# Patient Record
Sex: Female | Born: 1965 | Race: White | Hispanic: No | Smoking: Current every day smoker
Health system: Southern US, Community
[De-identification: ages and names within clinical notes are randomized; demographics above are authoritative.]

## PROBLEM LIST (undated history)

## (undated) DIAGNOSIS — K5900 Constipation, unspecified: Secondary | ICD-10-CM

## (undated) DIAGNOSIS — M797 Fibromyalgia: Secondary | ICD-10-CM

## (undated) DIAGNOSIS — Z8669 Personal history of other diseases of the nervous system and sense organs: Secondary | ICD-10-CM

## (undated) DIAGNOSIS — F419 Anxiety disorder, unspecified: Secondary | ICD-10-CM

## (undated) DIAGNOSIS — M2550 Pain in unspecified joint: Secondary | ICD-10-CM

## (undated) DIAGNOSIS — G8929 Other chronic pain: Secondary | ICD-10-CM

## (undated) DIAGNOSIS — T4145XA Adverse effect of unspecified anesthetic, initial encounter: Secondary | ICD-10-CM

## (undated) DIAGNOSIS — D649 Anemia, unspecified: Secondary | ICD-10-CM

## (undated) DIAGNOSIS — M2540 Effusion, unspecified joint: Secondary | ICD-10-CM

## (undated) DIAGNOSIS — G4700 Insomnia, unspecified: Secondary | ICD-10-CM

## (undated) DIAGNOSIS — T8859XA Other complications of anesthesia, initial encounter: Secondary | ICD-10-CM

## (undated) DIAGNOSIS — F410 Panic disorder [episodic paroxysmal anxiety] without agoraphobia: Secondary | ICD-10-CM

## (undated) DIAGNOSIS — E039 Hypothyroidism, unspecified: Secondary | ICD-10-CM

## (undated) DIAGNOSIS — Z8709 Personal history of other diseases of the respiratory system: Secondary | ICD-10-CM

## (undated) DIAGNOSIS — H04123 Dry eye syndrome of bilateral lacrimal glands: Secondary | ICD-10-CM

## (undated) DIAGNOSIS — F32A Depression, unspecified: Secondary | ICD-10-CM

## (undated) DIAGNOSIS — M549 Dorsalgia, unspecified: Secondary | ICD-10-CM

## (undated) DIAGNOSIS — IMO0002 Reserved for concepts with insufficient information to code with codable children: Secondary | ICD-10-CM

## (undated) DIAGNOSIS — K219 Gastro-esophageal reflux disease without esophagitis: Secondary | ICD-10-CM

## (undated) DIAGNOSIS — F329 Major depressive disorder, single episode, unspecified: Secondary | ICD-10-CM

## (undated) HISTORY — PX: CHOLECYSTECTOMY: SHX55

## (undated) HISTORY — PX: BILATERAL SALPINGOOPHORECTOMY: SHX1223

## (undated) HISTORY — PX: APPENDECTOMY: SHX54

## (undated) HISTORY — PX: TONSILLECTOMY: SUR1361

## (undated) HISTORY — PX: DIAGNOSTIC LAPAROSCOPY: SUR761

## (undated) HISTORY — PX: OTHER SURGICAL HISTORY: SHX169

## (undated) HISTORY — PX: ABDOMINAL HYSTERECTOMY: SHX81

---

## 2000-07-10 ENCOUNTER — Encounter: Payer: Self-pay | Admitting: Internal Medicine

## 2000-07-10 ENCOUNTER — Ambulatory Visit (HOSPITAL_COMMUNITY): Admission: RE | Admit: 2000-07-10 | Discharge: 2000-07-10 | Payer: Self-pay | Admitting: Internal Medicine

## 2001-03-11 ENCOUNTER — Encounter (HOSPITAL_COMMUNITY): Admission: RE | Admit: 2001-03-11 | Discharge: 2001-04-10 | Payer: Self-pay | Admitting: Family Medicine

## 2002-03-19 ENCOUNTER — Encounter: Payer: Self-pay | Admitting: Internal Medicine

## 2002-03-19 ENCOUNTER — Emergency Department (HOSPITAL_COMMUNITY): Admission: EM | Admit: 2002-03-19 | Discharge: 2002-03-19 | Payer: Self-pay | Admitting: Emergency Medicine

## 2002-03-21 ENCOUNTER — Encounter: Payer: Self-pay | Admitting: Internal Medicine

## 2002-03-21 ENCOUNTER — Ambulatory Visit (HOSPITAL_COMMUNITY): Admission: RE | Admit: 2002-03-21 | Discharge: 2002-03-21 | Payer: Self-pay | Admitting: Internal Medicine

## 2002-04-05 ENCOUNTER — Observation Stay (HOSPITAL_COMMUNITY): Admission: RE | Admit: 2002-04-05 | Discharge: 2002-04-07 | Payer: Self-pay | Admitting: General Surgery

## 2004-06-12 ENCOUNTER — Emergency Department (HOSPITAL_COMMUNITY): Admission: EM | Admit: 2004-06-12 | Discharge: 2004-06-12 | Payer: Self-pay | Admitting: Emergency Medicine

## 2005-03-25 ENCOUNTER — Emergency Department (HOSPITAL_COMMUNITY): Admission: EM | Admit: 2005-03-25 | Discharge: 2005-03-25 | Payer: Self-pay | Admitting: Emergency Medicine

## 2008-03-03 HISTORY — PX: KNEE ARTHROSCOPY: SUR90

## 2009-01-16 ENCOUNTER — Encounter: Admission: RE | Admit: 2009-01-16 | Discharge: 2009-01-16 | Payer: Self-pay | Admitting: Orthopedic Surgery

## 2009-03-03 HISTORY — PX: BACK SURGERY: SHX140

## 2009-03-03 HISTORY — PX: CERVICAL FUSION: SHX112

## 2009-12-05 ENCOUNTER — Inpatient Hospital Stay (HOSPITAL_COMMUNITY): Admission: RE | Admit: 2009-12-05 | Discharge: 2009-12-06 | Payer: Self-pay | Admitting: *Deleted

## 2010-01-23 ENCOUNTER — Ambulatory Visit (HOSPITAL_COMMUNITY)
Admission: RE | Admit: 2010-01-23 | Discharge: 2010-01-23 | Payer: Self-pay | Source: Home / Self Care | Attending: *Deleted | Admitting: *Deleted

## 2010-01-31 ENCOUNTER — Observation Stay (HOSPITAL_COMMUNITY)
Admission: RE | Admit: 2010-01-31 | Discharge: 2010-02-01 | Payer: Self-pay | Source: Home / Self Care | Attending: *Deleted | Admitting: *Deleted

## 2010-05-14 LAB — PROTIME-INR
INR: 0.93 (ref 0.00–1.49)
INR: 0.91 (ref 0.00–1.49)
Prothrombin Time: 12.7 seconds (ref 11.6–15.2)

## 2010-05-14 LAB — URINALYSIS, ROUTINE W REFLEX MICROSCOPIC
Bilirubin Urine: NEGATIVE
Glucose, UA: NEGATIVE mg/dL
Glucose, UA: NEGATIVE mg/dL
Ketones, ur: NEGATIVE mg/dL
Ketones, ur: NEGATIVE mg/dL
Leukocytes, UA: NEGATIVE
Leukocytes, UA: NEGATIVE
Nitrite: NEGATIVE
Nitrite: NEGATIVE
Protein, ur: NEGATIVE mg/dL
Protein, ur: NEGATIVE mg/dL
Specific Gravity, Urine: 1.022 (ref 1.005–1.030)
Specific Gravity, Urine: 1.009 (ref 1.005–1.030)
Urobilinogen, UA: 1 mg/dL (ref 0.0–1.0)
Urobilinogen, UA: 0.2 mg/dL (ref 0.0–1.0)
pH: 6 (ref 5.0–8.0)
pH: 6 (ref 5.0–8.0)

## 2010-05-14 LAB — TYPE AND SCREEN
ABO/RH(D): B POS
ABO/RH(D): B POS
Antibody Screen: NEGATIVE

## 2010-05-14 LAB — COMPREHENSIVE METABOLIC PANEL WITH GFR
ALT: 48 U/L — ABNORMAL HIGH (ref 0–35)
AST: 46 U/L — ABNORMAL HIGH (ref 0–37)
Albumin: 3.7 g/dL (ref 3.5–5.2)
Alkaline Phosphatase: 110 U/L (ref 39–117)
BUN: 8 mg/dL (ref 6–23)
CO2: 26 meq/L (ref 19–32)
Calcium: 8.5 mg/dL (ref 8.4–10.5)
Chloride: 96 meq/L (ref 96–112)
Creatinine, Ser: 0.9 mg/dL (ref 0.4–1.2)
GFR calc non Af Amer: >60 mL/min
Glucose, Bld: 104 mg/dL — ABNORMAL HIGH (ref 70–99)
Potassium: 3.2 meq/L — ABNORMAL LOW (ref 3.5–5.1)
Sodium: 136 meq/L (ref 135–145)
Total Bilirubin: 0.3 mg/dL (ref 0.3–1.2)
Total Protein: 6.1 g/dL (ref 6.0–8.3)

## 2010-05-14 LAB — CBC
HCT: 35.8 % — ABNORMAL LOW (ref 36.0–46.0)
HCT: 35.2 % — ABNORMAL LOW (ref 36.0–46.0)
Hemoglobin: 12.2 g/dL (ref 12.0–15.0)
Hemoglobin: 11.8 g/dL — ABNORMAL LOW (ref 12.0–15.0)
MCH: 31.6 pg (ref 26.0–34.0)
MCH: 32 pg (ref 26.0–34.0)
MCHC: 34.1 g/dL (ref 30.0–36.0)
MCHC: 33.5 g/dL (ref 30.0–36.0)
MCV: 92.7 fL (ref 78.0–100.0)
MCV: 95.4 fL (ref 78.0–100.0)
Platelets: 227 10*3/uL (ref 150–400)
Platelets: 276 10*3/uL (ref 150–400)
RBC: 3.86 MIL/uL — ABNORMAL LOW (ref 3.87–5.11)
RBC: 3.69 MIL/uL — ABNORMAL LOW (ref 3.87–5.11)
RDW: 13.2 % (ref 11.5–15.5)
RDW: 13.2 % (ref 11.5–15.5)
WBC: 5.9 10*3/uL (ref 4.0–10.5)
WBC: 7 10*3/uL (ref 4.0–10.5)

## 2010-05-14 LAB — DIFFERENTIAL
Basophils Absolute: 0 10*3/uL (ref 0.0–0.1)
Basophils Relative: 1 % (ref 0–1)
Eosinophils Absolute: 0.3 10*3/uL (ref 0.0–0.7)
Eosinophils Absolute: 0.3 10*3/uL (ref 0.0–0.7)
Eosinophils Relative: 5 % (ref 0–5)
Lymphocytes Relative: 15 % (ref 12–46)
Lymphs Abs: 0.9 10*3/uL (ref 0.7–4.0)
Lymphs Abs: 2.7 10*3/uL (ref 0.7–4.0)
Monocytes Absolute: 0.4 10*3/uL (ref 0.1–1.0)
Monocytes Relative: 5 % (ref 3–12)
Neutrophils Relative %: 75 % (ref 43–77)
Neutrophils Relative %: 51 % (ref 43–77)

## 2010-05-14 LAB — COMPREHENSIVE METABOLIC PANEL
ALT: 22 U/L (ref 0–35)
AST: 23 U/L (ref 0–37)
Albumin: 3.4 g/dL — ABNORMAL LOW (ref 3.5–5.2)
Alkaline Phosphatase: 77 U/L (ref 39–117)
Calcium: 8.6 mg/dL (ref 8.4–10.5)
GFR calc Af Amer: >60 mL/min (ref >60)
Potassium: 4 mEq/L (ref 3.5–5.1)
Sodium: 138 mEq/L (ref 135–145)
Total Protein: 5.7 g/dL — ABNORMAL LOW (ref 6.0–8.3)

## 2010-05-14 LAB — URINE MICROSCOPIC-ADD ON

## 2010-05-14 LAB — SURGICAL PCR SCREEN
MRSA, PCR: NEGATIVE
MRSA, PCR: NEGATIVE
Staphylococcus aureus: NEGATIVE

## 2010-05-14 LAB — APTT: aPTT: 27 s (ref 24–37)

## 2010-05-16 LAB — CBC
HCT: 39.1 % (ref 36.0–46.0)
MCH: 32.3 pg (ref 26.0–34.0)
MCHC: 34.3 g/dL (ref 30.0–36.0)
MCV: 94.2 fL (ref 78.0–100.0)
RDW: 13.4 % (ref 11.5–15.5)

## 2010-05-16 LAB — TYPE AND SCREEN
ABO/RH(D): B POS
Antibody Screen: NEGATIVE

## 2010-05-16 LAB — URINALYSIS, ROUTINE W REFLEX MICROSCOPIC
Bilirubin Urine: NEGATIVE
Nitrite: NEGATIVE
Specific Gravity, Urine: 1.005 (ref 1.005–1.030)
pH: 6.5 (ref 5.0–8.0)

## 2010-05-16 LAB — URINE MICROSCOPIC-ADD ON

## 2010-05-16 LAB — COMPREHENSIVE METABOLIC PANEL
BUN: 10 mg/dL (ref 6–23)
Calcium: 8.7 mg/dL (ref 8.4–10.5)
Creatinine, Ser: 0.74 mg/dL (ref 0.4–1.2)
GFR calc non Af Amer: >60 mL/min (ref >60)
Glucose, Bld: 88 mg/dL (ref 70–99)
Sodium: 137 mEq/L (ref 135–145)
Total Protein: 6.7 g/dL (ref 6.0–8.3)

## 2010-05-16 LAB — DIFFERENTIAL
Lymphs Abs: 2.9 10*3/uL (ref 0.7–4.0)
Monocytes Relative: 5 % (ref 3–12)
Neutro Abs: 10.1 10*3/uL — ABNORMAL HIGH (ref 1.7–7.7)
Neutrophils Relative %: 73 % (ref 43–77)

## 2010-05-16 LAB — APTT: aPTT: 26 seconds (ref 24–37)

## 2010-05-16 LAB — SURGICAL PCR SCREEN: MRSA, PCR: NEGATIVE

## 2010-05-16 LAB — PROTIME-INR
INR: 0.9 (ref 0.00–1.49)
Prothrombin Time: 12.4 seconds (ref 11.6–15.2)

## 2010-05-16 LAB — ABO/RH: ABO/RH(D): B POS

## 2010-07-19 NOTE — H&P (Signed)
   NAME:  Cynthia Barker, Cynthia Barker                         ACCOUNT NO.:  [id number]   MEDICAL RECORD NO.:  [id number]                   PATIENT TYPE:  AMB   LOCATION:  DAY                                  FACILITY:  [facility]   PHYSICIAN:  Jerolyn Shin C. Katrinka Blazing, M.D.                DATE OF BIRTH:  1965/12/08   DATE OF ADMISSION:  DATE OF DISCHARGE:                                HISTORY & PHYSICAL   HISTORY OF PRESENT ILLNESS:  [age]-year-old female with a six-month  history of pain in her right lower quadrant.  The pain is quite severe and  occurs three times per day, lasts a few minutes and then resolves.  She had  a severe attack about three weeks ago.  She has had nausea since that time.  She had an ultrasound of the abdomen which showed a questionable complex  right ovarian cyst.  A followup CT of the abdomen showed possible  inflammation of the appendix.  The patient has diffuse tenderness in the  right lower quadrant and is scheduled for a diagnostic laparoscopy and  laparoscopic appendectomy.   PAST HISTORY:  She has hypothyroidism, depression, seasonal allergies.   MEDICATIONS:  1. Synthroid 175 mcg daily.  2. Lexapro 10 mg daily.   PAST SURGICAL HISTORY:  C-section x3, total abdominal hysterectomy,  cholecystectomy and tonsillectomy.   FAMILY HISTORY:  Family history is positive for hypertension and diabetes  mellitus.   PHYSICAL EXAMINATION:  GENERAL:  On exam, she is a pleasant female in no  acute distress.  VITAL SIGNS:  Blood pressure 134/100, pulse 88, respirations 18.  Weight 254  pounds.  HEENT:  Unremarkable.  NECK:  Neck supple without JVD or bruit.  CHEST:  Chest clear to auscultation.  HEART:  Regular rate and rhythm without murmur, gallop or rub.  ABDOMEN:  Diffuse tenderness in the right lower quadrant and suprapubic  area.  Normoactive bowel sounds.  EXTREMITIES:  No cyanosis, clubbing or edema.  NEUROLOGIC:  Exam nonfocal.   IMPRESSION:  1. Chronic abdominal  pain with chronic right lower quadrant tenderness.  2. Hypotension.  3. Depression.   PLAN:  Diagnostic laparoscopy with probable laparoscopic appendectomy.                                              Dirk Dress. Katrinka Blazing, M.D.   LCS/MEDQ  D:  04/04/2002  T:  04/05/2002  Job:  [id number]

## 2010-07-19 NOTE — Op Note (Signed)
NAME:  Cynthia Barker, Cynthia Barker                         ACCOUNT NO.:  [id number]   MEDICAL RECORD NO.:  [id number]                   PATIENT TYPE:  OBV   LOCATION:  A305                                 FACILITY:  [facility]   PHYSICIAN:  Dirk Dress. Katrinka Blazing, M.D.                DATE OF BIRTH:  07/23/1965   DATE OF PROCEDURE:  04/05/2002  DATE OF DISCHARGE:                                 OPERATIVE REPORT   PREOPERATIVE DIAGNOSES:  1. Chronic pelvic pain.  2. Possible chronic appendicitis.   POSTOPERATIVE DIAGNOSES:  1. Chronic appendicitis.  2. Right paroophoritis.   PROCEDURES:  1. Laparoscopic appendectomy.  2. Pelvic adhesiolysis.   SURGEON:  Dirk Dress. Katrinka Blazing, M.D.   INDICATION FOR PROCEDURE:  A 45 year old female with history of recurrent  pain in the right lower quadrant for a duration of at least six months.  The  pain is quite severe, and it occurs at least three times daily.  It lasts a  few minutes and then resolved.  She had a severe attack three weeks ago.  She has had constant nausea since that time.  Ultrasound revealed complex  right ovarian cyst.  CT showed possible inflammation of the appendix.  The  patient had diffuse tenderness localized to the right lower quadrant, and  she is scheduled for diagnostic laparoscopy and appendectomy.   DESCRIPTION OF PROCEDURE:  Under general anesthesia, the patient's abdomen  was prepped and draped in a sterile field.  A supraumbilical incision was  made and a Veress needle was inserted uneventfully.  The abdomen was  insufflated with 3 L of CO2.  Using a Visiport guide, a 10 mm port was  placed.  A laparoscope was placed uneventfully.  Under videoscopic guidance,  a 5 mm port was placed in the suprapubic midline.  A 12 mm port was placed  in the left lower quadrant without difficulty.  Upon viewing the pelvis,  there was a thick, adherent ovary in the right lower pelvis.  There were  dense adhesions in the deep pelvis.  The ovary was densely  adherent to the  anterior abdominal from adhesions.  The appendix was lying in the right  gutter.  It was thickened, but there was no major inflammation or  inflammatory changes surrounding the appendix.  The adhesions in the pelvis  appeared to be to the bladder and adhesions of the bladder to the anterior  abdominal wall, probably from previous surgery.  The left ovary was not  visualized.  Initially the appendix was dissected.  Vessels of the  mesoappendix were clipped with hemoclips.  The base of the appendix was  transected using a vascular Endo-GIA stapler.  The appendix was retrieved  intact.  Next adhesions in the pelvis were taken down sharply using  Endoshears and electrocautery.  The ovary was gently dissected using sharp  and blunt dissection and was separated from the anterior abdominal  wall.  Because of the dense adhesions in this area, the ovary was covered in a  sheet of Interceed.  This was placed so as to try to prevent adhesions.  Copious irrigation was carried out.  The patient tolerated the procedure  well.  There was no bleeding at the end of the procedure.  CO2 was allowed  to escape from the abdomen, and the ports were  removed.  The incisions were closed using 0 Dexon on the fascia of the  larger incisions with staples on the skin.  The patient tolerated the  procedure well.  Dressings were placed.  She was awakened from anesthesia,  transferred to a bed, and taken to the postanesthetic care unit.                                                Dirk Dress. Katrinka Blazing, M.D.    LCS/MEDQ  D:  04/05/2002  T:  04/05/2002  Job:  [id number]

## 2010-08-30 ENCOUNTER — Emergency Department (HOSPITAL_COMMUNITY)
Admission: EM | Admit: 2010-08-30 | Discharge: 2010-08-30 | Disposition: A | Discharge status: Home / Self Care | Payer: Self-pay | Attending: Emergency Medicine | Admitting: Emergency Medicine

## 2010-08-30 ENCOUNTER — Emergency Department (HOSPITAL_COMMUNITY): Payer: Self-pay

## 2010-08-30 DIAGNOSIS — F3289 Other specified depressive episodes: Secondary | ICD-10-CM | POA: Insufficient documentation

## 2010-08-30 DIAGNOSIS — R059 Cough, unspecified: Secondary | ICD-10-CM | POA: Insufficient documentation

## 2010-08-30 DIAGNOSIS — E039 Hypothyroidism, unspecified: Secondary | ICD-10-CM | POA: Insufficient documentation

## 2010-08-30 DIAGNOSIS — R05 Cough: Secondary | ICD-10-CM | POA: Insufficient documentation

## 2010-08-30 DIAGNOSIS — F329 Major depressive disorder, single episode, unspecified: Secondary | ICD-10-CM | POA: Insufficient documentation

## 2012-09-28 ENCOUNTER — Encounter (HOSPITAL_COMMUNITY): Payer: Self-pay | Admitting: *Deleted

## 2012-09-28 ENCOUNTER — Emergency Department (HOSPITAL_COMMUNITY): Payer: Self-pay

## 2012-09-28 ENCOUNTER — Emergency Department (HOSPITAL_COMMUNITY)
Admission: EM | Admit: 2012-09-28 | Discharge: 2012-09-28 | Disposition: A | Discharge status: Home / Self Care | Payer: Self-pay | Attending: Emergency Medicine | Admitting: Emergency Medicine

## 2012-09-28 DIAGNOSIS — Z79899 Other long term (current) drug therapy: Secondary | ICD-10-CM | POA: Insufficient documentation

## 2012-09-28 DIAGNOSIS — R296 Repeated falls: Secondary | ICD-10-CM | POA: Insufficient documentation

## 2012-09-28 DIAGNOSIS — G8929 Other chronic pain: Secondary | ICD-10-CM | POA: Insufficient documentation

## 2012-09-28 DIAGNOSIS — S8392XA Sprain of unspecified site of left knee, initial encounter: Secondary | ICD-10-CM

## 2012-09-28 DIAGNOSIS — E039 Hypothyroidism, unspecified: Secondary | ICD-10-CM | POA: Insufficient documentation

## 2012-09-28 DIAGNOSIS — Y929 Unspecified place or not applicable: Secondary | ICD-10-CM | POA: Insufficient documentation

## 2012-09-28 DIAGNOSIS — IMO0002 Reserved for concepts with insufficient information to code with codable children: Secondary | ICD-10-CM | POA: Insufficient documentation

## 2012-09-28 DIAGNOSIS — M1710 Unilateral primary osteoarthritis, unspecified knee: Secondary | ICD-10-CM | POA: Insufficient documentation

## 2012-09-28 DIAGNOSIS — F17200 Nicotine dependence, unspecified, uncomplicated: Secondary | ICD-10-CM | POA: Insufficient documentation

## 2012-09-28 DIAGNOSIS — Y939 Activity, unspecified: Secondary | ICD-10-CM | POA: Insufficient documentation

## 2012-09-28 DIAGNOSIS — M1712 Unilateral primary osteoarthritis, left knee: Secondary | ICD-10-CM

## 2012-09-28 DIAGNOSIS — M545 Low back pain, unspecified: Secondary | ICD-10-CM | POA: Insufficient documentation

## 2012-09-28 NOTE — ED Notes (Signed)
Left knee pain x 2 months. Reports left knee "gave out" over the weekend, and then heard a "pop" last night; c/o worsening pain since then.

## 2012-09-29 NOTE — ED Provider Notes (Signed)
CSN: [id number]     Arrival date & time 09/28/12  1930 History     First MD Initiated Contact with Patient 09/28/12 1954     Chief Complaint  Patient presents with  . Knee Pain   (Consider location/radiation/quality/duration/timing/severity/associated sxs/prior Treatment) HPI Comments: Cynthia Barker is a 47 y.o. Female presenting with chronic bilateral knee pain,  But left greater than right for the past year,  And increasingly severe over the past 2 months.  She was walking into her home this last weekend when her left knee "gave out" on her causing a fall,  Then last night felt a sudden "popping" sensation with pain at the tibial tuberosity and now more severe pain and swelling.  She has chronic pain for which is on roxicodone 15 mg q 4 hours prn which is not relieving her symptoms.  She is scheduled to see Dr. Romeo Apple next week,  But was advised to come the ed tonight due to this increased pain.  Her pain is worse in the morning,  Reporting it is very difficult to weight bear when first getting out of bed in the morning.  She denies numbness or weakness in the left lower leg.  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       The history is provided by the patient and the spouse.    Past Medical History  Diagnosis Date  . Thyroid disease     hypothyroidism   Past Surgical History  Procedure Laterality Date  . Back surgery    . Knee arthroscopy     No family history on file. History  Substance Use Topics  . Smoking status: Current Every Day Smoker    Types: Cigarettes  . Smokeless tobacco: Not on file  . Alcohol Use: No   OB History   Grav Para Term Preterm Abortions TAB SAB Ect Mult Living                 Review of  Systems  Constitutional: Negative for fever.  Musculoskeletal: Positive for joint swelling, arthralgias and gait problem. Negative for myalgias.  Neurological: Negative for weakness and numbness.    Allergies  Dilaudid; Augmentin; and Onion  Home Medications   Current Outpatient Rx  Name  Route  Sig  Dispense  Refill  . ALPRAZolam (XANAX) 1 MG tablet   Oral   Take 1 mg by mouth daily as needed for sleep or anxiety.         . citalopram (CELEXA) 10 MG tablet   Oral   Take 10 mg by mouth daily.         Marland Kitchen docusate sodium (COLACE) 100 MG capsule   Oral   Take 100 mg by mouth daily.         . DULoxetine (CYMBALTA) 60 MG capsule   Oral   Take 60 mg by mouth daily.         Marland Kitchen estradiol (ESTRACE) 2 MG tablet   Oral   Take 2 mg by mouth daily.         Marland Kitchen gabapentin (NEURONTIN) 300 MG capsule   Oral   Take 300 mg by mouth 3 (three) times daily.         Marland Kitchen  ibuprofen (ADVIL,MOTRIN) 200 MG tablet   Oral   Take 200 mg by mouth every 6 (six) hours as needed for pain.         Marland Kitchen levothyroxine (SYNTHROID, LEVOTHROID) 175 MCG tablet   Oral   Take 175 mcg by mouth daily before breakfast.         . Methylcobalamin (B-12) 5000 MCG TBDP   Oral   Take 1 tablet by mouth daily.         Marland Kitchen oxyCODONE (ROXICODONE) 15 MG immediate release tablet   Oral   Take 15 mg by mouth every 4 (four) hours as needed for pain.         Marland Kitchen zinc gluconate 50 MG tablet   Oral   Take 50 mg by mouth daily.          BP 158/102  Pulse 73  Temp(Src) 98.4 F (36.9 C) (Oral)  Resp 24  Ht 5\' 7"  (1.702 m)  Wt 190 lb (86.183 kg)  BMI 29.75 kg/m2  SpO2 100% Physical Exam  Constitutional: She appears well-developed and well-nourished.  HENT:  Head: Atraumatic.  Neck: Normal range of motion.  Cardiovascular:  Pulses equal bilaterally  Musculoskeletal: She exhibits tenderness.       Left knee: She exhibits decreased range of motion, swelling and bony tenderness. She exhibits no  ecchymosis, no deformity, no erythema, normal alignment, no LCL laxity and no MCL laxity.  Moderate crepitus bilateral knees with ROM.  ttp left bilateral tibial plateau and tibial tuberosity. No palpable disruption of the patellar tendon,  Quad tendon is intact.  She can SLR with pain at her left knee, but not weakness.  Distal pulses intact,  Less than 3 sec cap refill.    Neurological: She is alert. She has normal strength. She displays normal reflexes. No sensory deficit.  Equal strength  Skin: Skin is warm and dry.  Psychiatric: She has a normal mood and affect.    ED Course   Procedures (including critical care time)  Labs Reviewed - No data to display Dg Knee Complete 4 Views Left  09/28/2012   *RADIOLOGY REPORT*  Clinical Data: Left knee pain.  The patient reports hearing a pop today.  Prior knee surgery.  LEFT KNEE - COMPLETE 4+ VIEW  Comparison: None.  Findings: The mineralization and alignment are normal.  There is no evidence of acute fracture or dislocation.  There are tricompartmental degenerative changes.  There is spurring at the quadriceps insertion on the patella.  No significant knee joint effusion is seen.  IMPRESSION: No acute osseous findings.  Tricompartmental degenerative changes.   Original Report Authenticated By: Carey Bullocks, M.D.   1. Knee sprain and strain, left, initial encounter   2. Osteoarthritis of left knee     MDM  Patients labs and/or radiological studies were viewed and considered during the medical decision making and disposition process. Discussed xray results.  Discussed that we cannot rule out possible patellar tendon injury, but no apparent complete tear, also with moderate djd, crepitus - may need mri for further evaluation of soft tissue structures.  Pt was placed in knee immobilizer for support,  Deferred crutches.  encouraged ice, elevation.  F/u with Dr. Romeo Apple as planned. Offered steroid course, pt did not desire this due to weight gain it  causes.  She will continue with her prescribed roxicodone, encouraged motrin.    The patient appears reasonably screened and/or stabilized for discharge and I doubt any other medical condition or other  EMC requiring further screening, evaluation, or treatment in the ED at this time prior to discharge.   Burgess Amor, PA-C 09/29/12 1420

## 2012-09-29 NOTE — ED Provider Notes (Signed)
Medical screening examination/treatment/procedure(s) were performed by non-physician practitioner and as supervising physician I was immediately available for consultation/collaboration.   Tredarius Cobern M Chayanne Speir, DO 09/29/12 1700 

## 2012-10-06 ENCOUNTER — Encounter: Payer: Self-pay | Admitting: Orthopedic Surgery

## 2012-10-06 ENCOUNTER — Ambulatory Visit (INDEPENDENT_AMBULATORY_CARE_PROVIDER_SITE_OTHER): Payer: 59 | Admitting: Orthopedic Surgery

## 2012-10-06 VITALS — BP 170/116 | Ht 67.0 in | Wt 210.0 lb

## 2012-10-06 DIAGNOSIS — S83207A Unspecified tear of unspecified meniscus, current injury, left knee, initial encounter: Secondary | ICD-10-CM

## 2012-10-06 DIAGNOSIS — IMO0002 Reserved for concepts with insufficient information to code with codable children: Secondary | ICD-10-CM

## 2012-10-06 DIAGNOSIS — S83242A Other tear of medial meniscus, current injury, left knee, initial encounter: Secondary | ICD-10-CM

## 2012-10-06 NOTE — Patient Instructions (Addendum)
MRI ORDERED 

## 2012-10-06 NOTE — Progress Notes (Signed)
Patient ID: Cynthia Barker, female   DOB: Apr 29, 1965, 47 y.o.   MRN: [id number] Chief Complaint  Patient presents with  . Knee Pain    Left knee pain, no recent  injury. Consult from Dr. Molli Hazard    History of present illness this is a 47 year old female status post cervical and lumbar fusions and left knee arthroscopy who presents with a new injury to her left knee after a fall loud pop was noted complains of medial knee pain severe. She has a history of lumbar pain which is chronic after her fusion her surgeons from [facility] have advised her that she will need chronic pain management. So she's not really here for that he is here for her knee. She had some knee discomfort but it was coming from her back this is new pain on the medial side of the joint She complains that her pain is a 10 despite being on 15 mg a Roxicodone. Complains of stabbing pain.  She is also on nucynta I think she is mainly describing her radicular pain from her back. However she does not complaining of locking but does complain of catching of the knee  The past, family history and social history have been reviewed and are recorded in the corresponding sections of epic   BP 170/116  Ht 5\' 7"  (1.702 m)  Wt 210 lb (95.255 kg)  BMI 32.88 kg/m2  General appearance is normal, the patient is alert and oriented x3 with normal mood and affect.  She has moderate amount of obesity. She is ambulating with a brace on her left leg which I have removed, it was a knee immobilizer she got up from the ER Active range of motion is limited to 115 passive range of motion is full and she has pain at terminal flexion. Collateral and cruciate ligaments remain stable motor exam is intact she has normal skin over the left knee. Shows good pulse and normal sensation without lymphadenopathy. Deep tendon reflexes remain intact she has good balance with her knee immobilizer on  Her right knee is examined for comparison and is noted to  have full range of motion normal alignment normal stability normal motor exam and normal skin neurovascular exam is intact without lymphadenopathy reflexes again are equal balance is normal  Upper extremity exam  The right and left upper extremity:   Inspection and palpation revealed no abnormalities in the upper extremities.   Range of motion is full without contracture.  Motor exam is normal with grade 5 strength.  The joints are fully reduced without subluxation.  There is no atrophy or tremor and muscle tone is normal.  All joints are stable.    Encounter Diagnoses  Name Primary?  . Torn meniscus, left, initial encounter Yes  . Acute medial meniscus tear of left knee, initial encounter     MRI left knee medial meniscal tear prepare for surgery  Decision making process included evaluation of notes from [facility] and Dr. Molli Hazard who is a chiropractor this included approximately 30 pages of notes

## 2012-10-11 ENCOUNTER — Telehealth: Payer: Self-pay | Admitting: Radiology

## 2012-10-11 NOTE — Telephone Encounter (Signed)
Patient has MRI at [facility] on 10-13-12 at 12:45. Patient has [employer], no precert is needed per recording. Patient will follow up back here in the office for her results.

## 2012-10-13 ENCOUNTER — Encounter (HOSPITAL_COMMUNITY): Payer: Self-pay

## 2012-10-13 ENCOUNTER — Ambulatory Visit (HOSPITAL_COMMUNITY)
Admission: RE | Admit: 2012-10-13 | Discharge: 2012-10-13 | Disposition: A | Discharge status: Home / Self Care | Payer: 59 | Source: Ambulatory Visit | Attending: Orthopedic Surgery | Admitting: Orthopedic Surgery

## 2012-10-13 DIAGNOSIS — S83207A Unspecified tear of unspecified meniscus, current injury, left knee, initial encounter: Secondary | ICD-10-CM

## 2012-10-13 DIAGNOSIS — M7120 Synovial cyst of popliteal space [Baker], unspecified knee: Secondary | ICD-10-CM | POA: Insufficient documentation

## 2012-10-13 DIAGNOSIS — M25469 Effusion, unspecified knee: Secondary | ICD-10-CM | POA: Insufficient documentation

## 2012-10-13 DIAGNOSIS — M25569 Pain in unspecified knee: Secondary | ICD-10-CM | POA: Insufficient documentation

## 2012-10-18 ENCOUNTER — Encounter: Payer: Self-pay | Admitting: Orthopedic Surgery

## 2012-10-18 ENCOUNTER — Ambulatory Visit (INDEPENDENT_AMBULATORY_CARE_PROVIDER_SITE_OTHER): Payer: 59 | Admitting: Orthopedic Surgery

## 2012-10-18 VITALS — BP 138/100 | Ht 67.0 in | Wt 210.0 lb

## 2012-10-18 DIAGNOSIS — M79609 Pain in unspecified limb: Secondary | ICD-10-CM

## 2012-10-18 DIAGNOSIS — M79605 Pain in left leg: Secondary | ICD-10-CM

## 2012-10-18 DIAGNOSIS — M25562 Pain in left knee: Secondary | ICD-10-CM

## 2012-10-18 DIAGNOSIS — G8929 Other chronic pain: Secondary | ICD-10-CM

## 2012-10-18 DIAGNOSIS — M25569 Pain in unspecified knee: Secondary | ICD-10-CM

## 2012-10-18 DIAGNOSIS — M549 Dorsalgia, unspecified: Secondary | ICD-10-CM

## 2012-10-18 NOTE — Patient Instructions (Addendum)
Referral to Dr Yevette Edwards for recurrent back pain after Fusion

## 2012-10-19 ENCOUNTER — Encounter: Payer: Self-pay | Admitting: Orthopedic Surgery

## 2012-10-19 DIAGNOSIS — M79605 Pain in left leg: Secondary | ICD-10-CM | POA: Insufficient documentation

## 2012-10-19 DIAGNOSIS — G8929 Other chronic pain: Secondary | ICD-10-CM | POA: Insufficient documentation

## 2012-10-19 DIAGNOSIS — M25562 Pain in left knee: Secondary | ICD-10-CM | POA: Insufficient documentation

## 2012-10-19 NOTE — Progress Notes (Signed)
Patient ID: Cynthia Barker, female   DOB: 1966/01/18, 47 y.o.   MRN: [id number]  Chief Complaint  Patient presents with  . Results    MRI Results Left knee    As noted in the previous history the patient has lumbar spine pain chronic requiring Roxicodone 15 mg 3-6 times per day depending on her activity such as standing which seems to cause her the most discomfort. She also complains of knee pain on the medial side her MRI shows questionable lateral meniscal repair with 3 compartment degenerative arthrosis.  Her symptoms seem to be radicular in nature unrelieved by narcotic pain medication indicating more likely neurogenic pain despite being on gabapentin  Review of systems no changes from her last visit approximately 2 weeks ago but we do note that her leg gives way without any provoking measures despite having a stable ligamentous exam   After review of the MRI agree with the following impression IMPRESSION:   1.  Tricompartmental degenerative changes most notable in the lateral compartment. 2.  Probable postoperative changes involving the lateral meniscus with findings suspicious for recurrent tears. 3.  Intact ligamentous structures and no acute bony findings. 4.  Small joint effusion and small Baker's cyst.  The patient was in the office for approximately one hour to discuss and explain to her that arthroscopic surgery will not relieve her symptoms. I have advised that she go back to her neurosurgeon and have her back checked out again.

## 2012-10-20 ENCOUNTER — Telehealth: Payer: Self-pay | Admitting: *Deleted

## 2012-10-20 NOTE — Telephone Encounter (Signed)
I called [facility] to schedule referral appointment with Dr, Yevette Edwards. The receptionist there was not able to schedule the patient due to a block on the patient's chart. She asked me to have the patient call their office and ask for [place], in the billing department. I called the patient and gave her the information.

## 2012-11-12 ENCOUNTER — Encounter (HOSPITAL_BASED_OUTPATIENT_CLINIC_OR_DEPARTMENT_OTHER): Payer: Self-pay | Admitting: *Deleted

## 2012-11-12 ENCOUNTER — Other Ambulatory Visit: Payer: Self-pay | Admitting: Orthopaedic Surgery

## 2012-11-12 NOTE — Progress Notes (Signed)
Pt has had surgery here in past-denies sleep apnea-

## 2012-11-12 NOTE — H&P (Signed)
Cynthia Barker is an 47 y.o. female.   Chief Complaint: Left knee pain HPI: This patient is a 47 year old female who is complaining of left knee pain that is really started to bother her more more recently.  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  She has had a previous arthroscopy in 2010 and did relatively well.  She has had a recent injection about 10 days ago which only lasted short term.  She does have trouble staying asleep at nighttime because of knee pain.  Pain is worse with increased activity such as ambulating.  Recent MRI scan from [facility] shows a recurrent lateral meniscus tear.We have discussed with her proceeding with an arthroscopy to help eliminate pain and improve her function.  Past Medical History  Diagnosis Date  . Thyroid disease     hypothyroidism    Past Surgical History  Procedure Laterality Date  . Back surgery    . Knee arthroscopy      No family history on file. Social History:  reports that she has been smoking Cigarettes.  She has been smoking about 0.00 packs per day. She does not have any smokeless tobacco history on file. She reports that she does not drink alcohol or use illicit drugs.  Allergies:  Allergies  Allergen Reactions  . Dilaudid [Hydromorphone Hcl] Anaphylaxis  . Augmentin [Amoxicillin-Pot Clavulanate] Nausea And Vomiting    PROJECTILE VOMITING  . Codeine Itching  . Onion Hives, Itching and Swelling    No prescriptions prior to admission    No results found for this or any previous visit (from the past 48 hour(s)). No results found.  Review of Systems  Constitutional: Negative.   HENT: Negative.   Eyes: Negative.   Respiratory: Negative.   Cardiovascular: Negative.   Gastrointestinal: Negative.   Genitourinary: Negative.   Musculoskeletal: Positive for joint pain.  Skin: Negative.   Neurological: Negative.   Endo/Heme/Allergies: Negative.      There were no vitals taken for this visit. Physical Exam  Constitutional: She is oriented to person, place, and time. She appears well-nourished.  HENT:  Head: Normocephalic.  Eyes: Pupils are equal, round, and reactive to light.  Neck: Normal range of motion.  Cardiovascular: Normal rate.   Respiratory: Effort normal.  GI: Soft.  Musculoskeletal:  Exam: Trace effusion.  Range of motion 0-1 20.  All healed.  Previous arthroscopy portals.  Crepitation 1+.  Pain on the medial and lateral joint line to palpation.  Stable ligaments.  McMurray's testing causes pain in both directions.  Neurological: She is oriented to person, place, and time.  Skin: Skin is warm.  Psychiatric: She has a normal mood and affect.     Assessment/Plan Left knee recurrent meniscus tear with previous arthroscopy 2010. Plan:The plan is to do a left knee arthroscopy to take care of meniscus tears. I have reviewed the risks of anesthesia, infection and DVT associated with this type procedure.  Postoperatively she would be out of work for 4-6 weeks and then be able to go back seated for a few weeks before regular duty.  Cynthia Barker R 11/12/2012, 12:45 PM

## 2012-11-16 ENCOUNTER — Ambulatory Visit: Payer: 59 | Admitting: Orthopedic Surgery

## 2012-11-16 ENCOUNTER — Ambulatory Visit (HOSPITAL_BASED_OUTPATIENT_CLINIC_OR_DEPARTMENT_OTHER): Admission: RE | Admit: 2012-11-16 | Payer: 59 | Source: Ambulatory Visit | Admitting: Orthopaedic Surgery

## 2012-11-16 HISTORY — DX: Other chronic pain: G89.29

## 2012-11-16 HISTORY — DX: Anxiety disorder, unspecified: F41.9

## 2012-11-16 HISTORY — DX: Reserved for concepts with insufficient information to code with codable children: IMO0002

## 2012-11-16 HISTORY — DX: Depression, unspecified: F32.A

## 2012-11-16 HISTORY — DX: Major depressive disorder, single episode, unspecified: F32.9

## 2012-11-16 SURGERY — ARTHROSCOPY, KNEE
Anesthesia: Choice | Site: Knee | Laterality: Left

## 2012-11-17 ENCOUNTER — Encounter: Payer: Self-pay | Admitting: Orthopedic Surgery

## 2013-01-07 ENCOUNTER — Telehealth: Payer: Self-pay | Admitting: Orthopedic Surgery

## 2013-01-07 NOTE — Telephone Encounter (Signed)
Office notes+MRI report per provider Dr. Vickki Hearing faxed  to referring provider, Dr. Reatha Harps, [facility], as per request, to attention: Myra, [phone], ph#[phone].

## 2013-04-14 ENCOUNTER — Other Ambulatory Visit: Payer: Self-pay | Admitting: Orthopaedic Surgery

## 2013-04-19 ENCOUNTER — Encounter (HOSPITAL_COMMUNITY): Payer: Self-pay | Admitting: Pharmacy Technician

## 2013-04-20 ENCOUNTER — Encounter (HOSPITAL_COMMUNITY)
Admission: RE | Admit: 2013-04-20 | Discharge: 2013-04-20 | Disposition: A | Discharge status: Home / Self Care | Payer: BC Managed Care – PPO | Source: Ambulatory Visit | Attending: Orthopaedic Surgery | Admitting: Orthopaedic Surgery

## 2013-04-20 ENCOUNTER — Encounter (HOSPITAL_COMMUNITY): Payer: Self-pay

## 2013-04-20 DIAGNOSIS — Z01810 Encounter for preprocedural cardiovascular examination: Secondary | ICD-10-CM | POA: Insufficient documentation

## 2013-04-20 DIAGNOSIS — Z01812 Encounter for preprocedural laboratory examination: Secondary | ICD-10-CM | POA: Insufficient documentation

## 2013-04-20 DIAGNOSIS — Z01818 Encounter for other preprocedural examination: Secondary | ICD-10-CM | POA: Insufficient documentation

## 2013-04-20 HISTORY — DX: Pain in unspecified joint: M25.50

## 2013-04-20 HISTORY — DX: Other chronic pain: G89.29

## 2013-04-20 HISTORY — DX: Panic disorder (episodic paroxysmal anxiety): F41.0

## 2013-04-20 HISTORY — DX: Other complications of anesthesia, initial encounter: T88.59XA

## 2013-04-20 HISTORY — DX: Anemia, unspecified: D64.9

## 2013-04-20 HISTORY — DX: Constipation, unspecified: K59.00

## 2013-04-20 HISTORY — DX: Personal history of other diseases of the respiratory system: Z87.09

## 2013-04-20 HISTORY — DX: Fibromyalgia: M79.7

## 2013-04-20 HISTORY — DX: Insomnia, unspecified: G47.00

## 2013-04-20 HISTORY — DX: Personal history of other diseases of the nervous system and sense organs: Z86.69

## 2013-04-20 HISTORY — DX: Dry eye syndrome of bilateral lacrimal glands: H04.123

## 2013-04-20 HISTORY — DX: Effusion, unspecified joint: M25.40

## 2013-04-20 HISTORY — DX: Gastro-esophageal reflux disease without esophagitis: K21.9

## 2013-04-20 HISTORY — DX: Hypothyroidism, unspecified: E03.9

## 2013-04-20 HISTORY — DX: Dorsalgia, unspecified: M54.9

## 2013-04-20 HISTORY — DX: Adverse effect of unspecified anesthetic, initial encounter: T41.45XA

## 2013-04-20 LAB — URINALYSIS, ROUTINE W REFLEX MICROSCOPIC
Bilirubin Urine: NEGATIVE
Glucose, UA: NEGATIVE mg/dL
HGB URINE DIPSTICK: NEGATIVE
Ketones, ur: NEGATIVE mg/dL
LEUKOCYTES UA: NEGATIVE
NITRITE: NEGATIVE
PROTEIN: NEGATIVE mg/dL
Specific Gravity, Urine: 1.008 (ref 1.005–1.030)
UROBILINOGEN UA: 0.2 mg/dL (ref 0.0–1.0)
pH: 6.5 (ref 5.0–8.0)

## 2013-04-20 LAB — CBC WITH DIFFERENTIAL/PLATELET
Basophils Absolute: 0 10*3/uL (ref 0.0–0.1)
Basophils Relative: 0 % (ref 0–1)
Eosinophils Absolute: 0.5 10*3/uL (ref 0.0–0.7)
Eosinophils Relative: 4 % (ref 0–5)
HEMATOCRIT: 38.3 % (ref 36.0–46.0)
HEMOGLOBIN: 13.7 g/dL (ref 12.0–15.0)
LYMPHS PCT: 27 % (ref 12–46)
Lymphs Abs: 2.9 10*3/uL (ref 0.7–4.0)
MCH: 33 pg (ref 26.0–34.0)
MCHC: 35.8 g/dL (ref 30.0–36.0)
MCV: 92.3 fL (ref 78.0–100.0)
MONO ABS: 0.7 10*3/uL (ref 0.1–1.0)
MONOS PCT: 7 % (ref 3–12)
NEUTROS ABS: 6.7 10*3/uL (ref 1.7–7.7)
Neutrophils Relative %: 62 % (ref 43–77)
Platelets: 263 10*3/uL (ref 150–400)
RBC: 4.15 MIL/uL (ref 3.87–5.11)
RDW: 13.2 % (ref 11.5–15.5)
WBC: 10.8 10*3/uL — AB (ref 4.0–10.5)

## 2013-04-20 LAB — TYPE AND SCREEN
ABO/RH(D): B POS
ANTIBODY SCREEN: NEGATIVE

## 2013-04-20 LAB — BASIC METABOLIC PANEL
BUN: 12 mg/dL (ref 6–23)
CHLORIDE: 101 meq/L (ref 96–112)
CO2: 27 meq/L (ref 19–32)
Calcium: 9 mg/dL (ref 8.4–10.5)
Creatinine, Ser: 0.77 mg/dL (ref 0.50–1.10)
GFR calc non Af Amer: >90 mL/min (ref >90)
Glucose, Bld: 98 mg/dL (ref 70–99)
POTASSIUM: 3.6 meq/L — AB (ref 3.7–5.3)
Sodium: 141 mEq/L (ref 137–147)

## 2013-04-20 LAB — PROTIME-INR
INR: 0.91 (ref 0.00–1.49)
Prothrombin Time: 12.1 seconds (ref 11.6–15.2)

## 2013-04-20 LAB — APTT: APTT: 29 s (ref 24–37)

## 2013-04-20 LAB — SURGICAL PCR SCREEN
MRSA, PCR: NEGATIVE
STAPHYLOCOCCUS AUREUS: NEGATIVE

## 2013-04-20 MED ORDER — CHLORHEXIDINE GLUCONATE 4 % EX LIQD: 60.0000 mL | Freq: Once | CUTANEOUS | Status: DC

## 2013-04-20 NOTE — Progress Notes (Signed)
Pt doesn't have a cardiologist  Denies ever having an echo/stress test/heart cath  Denies EKG or CXR in past yr  [facility] [place]

## 2013-04-20 NOTE — Pre-Procedure Instructions (Signed)
Danielle Rankinoyce R Aultman  04/20/2013   Your procedure is scheduled on:  Tues, Feb 24 @ 10:20 AM  [facility]  at 8:15 AM.  Call this number if you have problems the morning of surgery: [phone]   Remember:   Do not eat food or drink liquids after midnight.   Take these medicines the morning of surgery with A SIP OF WATER: Xanax(Alprazolam),Celexa(Citalopram),Cymbalta(Duloxetine),Synthroid(Levothyroxine),and Pain Pill(if needed)              Stop taking your Naproxen. No Goody's,BC's,Aspirin,Ibuprofen,Fish Oil,or any Herbal Medications   Do not wear jewelry, make-up or nail polish.  Do not wear lotions, powders, or perfumes. You may wear deodorant.  Do not shave 48 hours prior to surgery.   Do not bring valuables to the hospital.  [facility] is not responsible                  for any belongings or valuables.               Contacts, dentures or bridgework may not be worn into surgery.  Leave suitcase in the car. After surgery it may be brought to your room.  For patients admitted to the hospital, discharge time is determined by your                treatment team.                Special Instructions:  [place] - Preparing for Surgery  Before surgery, you can play an important role.  Because skin is not sterile, your skin needs to be as free of germs as possible.  You can reduce the number of germs on you skin by washing with CHG (chlorahexidine gluconate) soap before surgery.  CHG is an antiseptic cleaner which kills germs and bonds with the skin to continue killing germs even after washing.  Please DO NOT use if you have an allergy to CHG or antibacterial soaps.  If your skin becomes reddened/irritated stop using the CHG and inform your nurse when you arrive at Short Stay.  Do not shave (including legs and underarms) for at least 48 hours prior to the first CHG shower.  You may shave your face.  Please follow these instructions carefully:   1.  Shower  with CHG Soap the night before surgery and the                                morning of Surgery.  2.  If you choose to wash your hair, wash your hair first as usual with your       normal shampoo.  3.  After you shampoo, rinse your hair and body thoroughly to remove the                      Shampoo.  4.  Use CHG as you would any other liquid soap.  You can apply chg directly       to the skin and wash gently with scrungie or a clean washcloth.  5.  Apply the CHG Soap to your body ONLY FROM THE NECK DOWN.        Do not use on open wounds or open sores.  Avoid contact with your eyes,       ears, mouth and genitals (private parts).  Wash genitals (private parts)  with your normal soap.  6.  Wash thoroughly, paying special attention to the area where your surgery        will be performed.  7.  Thoroughly rinse your body with warm water from the neck down.  8.  DO NOT shower/wash with your normal soap after using and rinsing off       the CHG Soap.  9.  Pat yourself dry with a clean towel.            10.  Wear clean pajamas.            11.  Place clean sheets on your bed the night of your first shower and do not        sleep with pets.  Day of Surgery  Do not apply any lotions/deoderants the morning of surgery.  Please wear clean clothes to the hospital/surgery center.     Please read over the following fact sheets that you were given: Pain Booklet, Coughing and Deep Breathing, Blood Transfusion Information, MRSA Information and Surgical Site Infection Prevention

## 2013-04-22 NOTE — H&P (Signed)
TOTAL KNEE ADMISSION H&P  Patient is being admitted for left total knee arthroplasty.  Subjective:  Chief Complaint:left knee pain.  HPI: Cynthia Barker, 48 y.o. female, has a history of pain and functional disability in the left knee due to arthritis and has failed non-surgical conservative treatments for greater than 12 weeks to includeNSAID's and/or analgesics, corticosteriod injections, use of assistive devices, weight reduction as appropriate and activity modification.  Onset of symptoms was gradual, starting 5 years ago with gradually worsening course since that time. The patient noted prior procedures on the knee to include  arthroscopy on the left knee(s).  Patient currently rates pain in the left knee(s) at 9 out of 10 with activity. Patient has night pain, worsening of pain with activity and weight bearing, pain that interferes with activities of daily living, crepitus and joint swelling.  Patient has evidence of subchondral sclerosis, periarticular osteophytes and joint space narrowing by imaging studies.  There is no active infection.  Patient Active Problem List   Diagnosis Date Noted  . Left knee pain 10/19/2012  . Left leg pain 10/19/2012  . Back pain, chronic 10/19/2012   Past Medical History  Diagnosis Date  . Chronic pain   . DDD (degenerative disc disease)   . Anxiety     takes Xanax daily as needed  . GERD (gastroesophageal reflux disease)     takes Tums as needed  . Depression     takes Celexa and Cymbalta daily  . Constipation     takes Colace daily  . Hypothyroidism     takes Synthroid daily  . Insomnia     takes Ambien nightly  . Dry eyes     uses eye drops daily as needed  . Complication of anesthesia     woke up with 1st C/S(was put to sleep d/t emergency  . Panic attacks   . History of bronchitis     last time 4months ago  . History of migraine     last one summer 2014  . Joint pain   . Joint swelling   . Chronic back pain     stenosis  .  Anemia     after partial hysterectomy  . Fibromyalgia     takes Cymbalta daily    Past Surgical History  Procedure Laterality Date  . Knee arthroscopy Left 2010    x 2  . Back surgery  2011    lumb fusion  . Cervical fusion  2011  . Cholecystectomy    . Appendectomy    . Bilateral salpingoophorectomy    . Tonsillectomy      at age 32  . Cesarean section      x 3  . Diagnostic laparoscopy      x 6 for endometriosis  . Abdominal hysterectomy      partial and then 9 yrs later complete  . Tummy tuck      38yrs ago  . Epidural injections      x 3  . Skin cancer remvoved from forehead      No prescriptions prior to admission   Allergies  Allergen Reactions  . Dilaudid [Hydromorphone Hcl] Anaphylaxis  . Augmentin [Amoxicillin-Pot Clavulanate] Nausea And Vomiting    PROJECTILE VOMITING  . Codeine Itching    Ok to take with benadryl  . Onion Hives, Itching and Swelling    History  Substance Use Topics  . Smoking status: Current Every Day Smoker -- 0.50 packs/day for 30 years  Types: Cigarettes  . Smokeless tobacco: Not on file     Comment: electric cig  . Alcohol Use: No    No family history on file.   Review of Systems  Musculoskeletal: Positive for joint pain.  All other systems reviewed and are negative.    Objective:  Physical Exam  Constitutional: She is oriented to person, place, and time. She appears well-developed and well-nourished.  HENT:  Head: Normocephalic and atraumatic.  Eyes: Conjunctivae and EOM are normal. Pupils are equal, round, and reactive to light.  Neck: Normal range of motion.  Cardiovascular: Normal rate, normal heart sounds and intact distal pulses.   Respiratory: Effort normal.  GI: Soft.  Musculoskeletal:  Cynthia Barker walks with a fairly altered gait.  She has trace effusion about the left knee with motion from about 5-110.  She has healed arthroscopic portals.  There is no redness or swelling.  Her calf is soft and nontender.  She  has medial and lateral joint line pain and crepitation with motion.  Opposite knee has lesser effusion and greater motion with just mild joint line pain.   Hip motion is full and painfree and SLR is negative on both sides.  There is no palpable LAD behind either knee.  Sensation and motor function are intact on both sides and there are palpable pulses on both sides.    Neurological: She is alert and oriented to person, place, and time.  Skin: Skin is warm and dry.  Psychiatric: She has a normal mood and affect. Her behavior is normal. Judgment and thought content normal.    Vital signs in last 24 hours:    Labs:   Estimated body mass index is 32.88 kg/(m^2) as calculated from the following:   Height as of 11/12/12: 5\' 7"  (1.702 m).   Weight as of 11/16/12: 95.255 kg (210 lb).   Imaging Review Plain radiographs demonstrate severe degenerative joint disease of the left knee(s). The overall alignment isneutral. The bone quality appears to be good for age and reported activity level.  Assessment/Plan:  End stage arthritis, left knee   The patient history, physical examination, clinical judgment of the provider and imaging studies are consistent with end stage degenerative joint disease of the left knee(s) and total knee arthroplasty is deemed medically necessary. The treatment options including medical management, injection therapy arthroscopy and arthroplasty were discussed at length. The risks and benefits of total knee arthroplasty were presented and reviewed. The risks due to aseptic loosening, infection, stiffness, patella tracking problems, thromboembolic complications and other imponderables were discussed. The patient acknowledged the explanation, agreed to proceed with the plan and consent was signed. Patient is being admitted for inpatient treatment for surgery, pain control, PT, OT, prophylactic antibiotics, VTE prophylaxis, progressive ambulation and ADL's and discharge planning. The  patient is planning to be discharged home with home health services

## 2013-04-25 MED ORDER — VANCOMYCIN HCL IN DEXTROSE 1-5 GM/200ML-% IV SOLN
1000.0000 mg | INTRAVENOUS | Status: AC
  Administered 2013-04-26: 1000 mg via INTRAVENOUS
  Filled 2013-04-25: qty 200

## 2013-04-25 MED ORDER — LACTATED RINGERS IV SOLN
INTRAVENOUS | Status: DC
  Administered 2013-04-26 (×2): via INTRAVENOUS

## 2013-04-26 ENCOUNTER — Inpatient Hospital Stay (HOSPITAL_COMMUNITY)
Admission: RE | Admit: 2013-04-26 | Discharge: 2013-04-28 | DRG: 470 | Disposition: A | Discharge status: Home Health | Payer: BC Managed Care – PPO | Source: Ambulatory Visit | Attending: Orthopaedic Surgery | Admitting: Orthopaedic Surgery

## 2013-04-26 ENCOUNTER — Inpatient Hospital Stay (HOSPITAL_COMMUNITY): Payer: BC Managed Care – PPO | Admitting: Anesthesiology

## 2013-04-26 ENCOUNTER — Encounter (HOSPITAL_COMMUNITY): Payer: Self-pay | Admitting: *Deleted

## 2013-04-26 ENCOUNTER — Encounter (HOSPITAL_COMMUNITY)
Admission: RE | Disposition: A | Discharge status: Home Health | Payer: Self-pay | Source: Ambulatory Visit | Attending: Orthopaedic Surgery

## 2013-04-26 ENCOUNTER — Encounter (HOSPITAL_COMMUNITY): Payer: BC Managed Care – PPO | Admitting: Anesthesiology

## 2013-04-26 DIAGNOSIS — G4700 Insomnia, unspecified: Secondary | ICD-10-CM | POA: Diagnosis present

## 2013-04-26 DIAGNOSIS — F3289 Other specified depressive episodes: Secondary | ICD-10-CM | POA: Diagnosis present

## 2013-04-26 DIAGNOSIS — Z6831 Body mass index (BMI) 31.0-31.9, adult: Secondary | ICD-10-CM

## 2013-04-26 DIAGNOSIS — Z91018 Allergy to other foods: Secondary | ICD-10-CM

## 2013-04-26 DIAGNOSIS — F411 Generalized anxiety disorder: Secondary | ICD-10-CM | POA: Diagnosis present

## 2013-04-26 DIAGNOSIS — Z01812 Encounter for preprocedural laboratory examination: Secondary | ICD-10-CM

## 2013-04-26 DIAGNOSIS — Z880 Allergy status to penicillin: Secondary | ICD-10-CM

## 2013-04-26 DIAGNOSIS — E039 Hypothyroidism, unspecified: Secondary | ICD-10-CM | POA: Diagnosis present

## 2013-04-26 DIAGNOSIS — Z889 Allergy status to unspecified drugs, medicaments and biological substances status: Secondary | ICD-10-CM

## 2013-04-26 DIAGNOSIS — G8929 Other chronic pain: Secondary | ICD-10-CM | POA: Diagnosis present

## 2013-04-26 DIAGNOSIS — Z01818 Encounter for other preprocedural examination: Secondary | ICD-10-CM

## 2013-04-26 DIAGNOSIS — K5900 Constipation, unspecified: Secondary | ICD-10-CM | POA: Diagnosis present

## 2013-04-26 DIAGNOSIS — K219 Gastro-esophageal reflux disease without esophagitis: Secondary | ICD-10-CM | POA: Diagnosis present

## 2013-04-26 DIAGNOSIS — E669 Obesity, unspecified: Secondary | ICD-10-CM | POA: Diagnosis present

## 2013-04-26 DIAGNOSIS — Z85828 Personal history of other malignant neoplasm of skin: Secondary | ICD-10-CM

## 2013-04-26 DIAGNOSIS — Z981 Arthrodesis status: Secondary | ICD-10-CM

## 2013-04-26 DIAGNOSIS — IMO0001 Reserved for inherently not codable concepts without codable children: Secondary | ICD-10-CM | POA: Diagnosis present

## 2013-04-26 DIAGNOSIS — F410 Panic disorder [episodic paroxysmal anxiety] without agoraphobia: Secondary | ICD-10-CM | POA: Diagnosis present

## 2013-04-26 DIAGNOSIS — F17200 Nicotine dependence, unspecified, uncomplicated: Secondary | ICD-10-CM | POA: Diagnosis present

## 2013-04-26 DIAGNOSIS — Z885 Allergy status to narcotic agent status: Secondary | ICD-10-CM

## 2013-04-26 DIAGNOSIS — F329 Major depressive disorder, single episode, unspecified: Secondary | ICD-10-CM | POA: Diagnosis present

## 2013-04-26 DIAGNOSIS — M1712 Unilateral primary osteoarthritis, left knee: Secondary | ICD-10-CM | POA: Diagnosis present

## 2013-04-26 DIAGNOSIS — H04129 Dry eye syndrome of unspecified lacrimal gland: Secondary | ICD-10-CM | POA: Diagnosis present

## 2013-04-26 DIAGNOSIS — M1710 Unilateral primary osteoarthritis, unspecified knee: Principal | ICD-10-CM | POA: Diagnosis present

## 2013-04-26 DIAGNOSIS — IMO0002 Reserved for concepts with insufficient information to code with codable children: Secondary | ICD-10-CM | POA: Diagnosis present

## 2013-04-26 DIAGNOSIS — G43909 Migraine, unspecified, not intractable, without status migrainosus: Secondary | ICD-10-CM | POA: Diagnosis present

## 2013-04-26 DIAGNOSIS — Z01810 Encounter for preprocedural cardiovascular examination: Secondary | ICD-10-CM

## 2013-04-26 HISTORY — PX: TOTAL KNEE ARTHROPLASTY: SHX125

## 2013-04-26 SURGERY — ARTHROPLASTY, KNEE, TOTAL
Anesthesia: General | Site: Knee | Laterality: Left

## 2013-04-26 MED ORDER — PHENOL 1.4 % MT LIQD: 1.0000 | OROMUCOSAL | Status: DC | PRN

## 2013-04-26 MED ORDER — DULOXETINE HCL 60 MG PO CPEP
60.0000 mg | ORAL_CAPSULE | Freq: Every day | ORAL | Status: DC
Start: 2013-04-27 — End: 2013-04-28
  Administered 2013-04-27 – 2013-04-28 (×2): 60 mg via ORAL
  Filled 2013-04-26 (×2): qty 1

## 2013-04-26 MED ORDER — ZOLPIDEM TARTRATE 5 MG PO TABS
5.0000 mg | ORAL_TABLET | Freq: Every day | ORAL | Status: DC
  Administered 2013-04-26 – 2013-04-27 (×2): 5 mg via ORAL
  Filled 2013-04-26 (×2): qty 1

## 2013-04-26 MED ORDER — LACTATED RINGERS IV SOLN: INTRAVENOUS | Status: DC

## 2013-04-26 MED ORDER — DIPHENHYDRAMINE HCL 12.5 MG/5ML PO ELIX: 12.5000 mg | ORAL_SOLUTION | ORAL | Status: DC | PRN

## 2013-04-26 MED ORDER — MORPHINE SULFATE 2 MG/ML IJ SOLN
INTRAMUSCULAR | Status: AC
  Filled 2013-04-26: qty 1

## 2013-04-26 MED ORDER — VANCOMYCIN HCL IN DEXTROSE 1-5 GM/200ML-% IV SOLN
1000.0000 mg | Freq: Two times a day (BID) | INTRAVENOUS | Status: AC
  Administered 2013-04-26: 1000 mg via INTRAVENOUS
  Filled 2013-04-26: qty 200

## 2013-04-26 MED ORDER — OXYCODONE HCL 5 MG PO TABS
ORAL_TABLET | ORAL | Status: AC
  Filled 2013-04-26: qty 4

## 2013-04-26 MED ORDER — FENTANYL CITRATE 0.05 MG/ML IJ SOLN
INTRAMUSCULAR | Status: DC | PRN
  Administered 2013-04-26 (×4): 50 ug via INTRAVENOUS
  Administered 2013-04-26 (×2): 100 ug via INTRAVENOUS

## 2013-04-26 MED ORDER — MIDAZOLAM HCL 2 MG/2ML IJ SOLN
INTRAMUSCULAR | Status: AC
  Filled 2013-04-26: qty 2

## 2013-04-26 MED ORDER — METHOCARBAMOL 100 MG/ML IJ SOLN
500.0000 mg | Freq: Four times a day (QID) | INTRAVENOUS | Status: DC | PRN
  Filled 2013-04-26: qty 5

## 2013-04-26 MED ORDER — CALCIUM CARBONATE ANTACID 500 MG PO CHEW
3.0000 | CHEWABLE_TABLET | Freq: Every day | ORAL | Status: DC | PRN
  Filled 2013-04-26: qty 3

## 2013-04-26 MED ORDER — ONDANSETRON HCL 4 MG/2ML IJ SOLN: 4.0000 mg | Freq: Once | INTRAMUSCULAR | Status: DC | PRN

## 2013-04-26 MED ORDER — FENTANYL CITRATE 0.05 MG/ML IJ SOLN
INTRAMUSCULAR | Status: AC
  Filled 2013-04-26: qty 2

## 2013-04-26 MED ORDER — METOCLOPRAMIDE HCL 5 MG/ML IJ SOLN: 5.0000 mg | Freq: Three times a day (TID) | INTRAMUSCULAR | Status: DC | PRN

## 2013-04-26 MED ORDER — VISINE FOR CONTACTS SOLN: 1.0000 [drp] | Freq: Every day | Status: DC | PRN

## 2013-04-26 MED ORDER — ZOLPIDEM TARTRATE 5 MG PO TABS: 10.0000 mg | ORAL_TABLET | Freq: Every day | ORAL | Status: DC

## 2013-04-26 MED ORDER — MORPHINE SULFATE 2 MG/ML IJ SOLN
2.0000 mg | INTRAMUSCULAR | Status: DC | PRN
  Administered 2013-04-26 – 2013-04-28 (×8): 2 mg via INTRAVENOUS
  Filled 2013-04-26 (×8): qty 1

## 2013-04-26 MED ORDER — VITAMIN B-12 5000 MCG SL SUBL: 5000.0000 ug | SUBLINGUAL_TABLET | Freq: Every day | SUBLINGUAL | Status: DC

## 2013-04-26 MED ORDER — ACETAMINOPHEN 650 MG RE SUPP: 650.0000 mg | Freq: Four times a day (QID) | RECTAL | Status: DC | PRN

## 2013-04-26 MED ORDER — EPHEDRINE SULFATE 50 MG/ML IJ SOLN
INTRAMUSCULAR | Status: AC
  Filled 2013-04-26: qty 1

## 2013-04-26 MED ORDER — DIPHENHYDRAMINE HCL 25 MG PO TABS
25.0000 mg | ORAL_TABLET | Freq: Two times a day (BID) | ORAL | Status: DC | PRN
  Administered 2013-04-27 – 2013-04-28 (×2): 25 mg via ORAL
  Filled 2013-04-26 (×3): qty 1

## 2013-04-26 MED ORDER — MENTHOL 3 MG MT LOZG: 1.0000 | LOZENGE | OROMUCOSAL | Status: DC | PRN

## 2013-04-26 MED ORDER — POLYVINYL ALCOHOL 1.4 % OP SOLN: 1.0000 [drp] | Freq: Every day | OPHTHALMIC | Status: DC | PRN

## 2013-04-26 MED ORDER — ONDANSETRON HCL 4 MG/2ML IJ SOLN
INTRAMUSCULAR | Status: DC | PRN
  Administered 2013-04-26: 4 mg via INTRAVENOUS

## 2013-04-26 MED ORDER — LIDOCAINE HCL (CARDIAC) 20 MG/ML IV SOLN
INTRAVENOUS | Status: AC
  Filled 2013-04-26: qty 5

## 2013-04-26 MED ORDER — ASPIRIN EC 325 MG PO TBEC
325.0000 mg | DELAYED_RELEASE_TABLET | Freq: Two times a day (BID) | ORAL | Status: DC
  Administered 2013-04-27 – 2013-04-28 (×3): 325 mg via ORAL
  Filled 2013-04-26 (×5): qty 1

## 2013-04-26 MED ORDER — BISACODYL 5 MG PO TBEC
5.0000 mg | DELAYED_RELEASE_TABLET | Freq: Every day | ORAL | Status: DC | PRN
  Administered 2013-04-28: 5 mg via ORAL
  Filled 2013-04-26: qty 1

## 2013-04-26 MED ORDER — FENTANYL CITRATE 0.05 MG/ML IJ SOLN
25.0000 ug | INTRAMUSCULAR | Status: AC | PRN
  Administered 2013-04-26 (×6): 50 ug via INTRAVENOUS

## 2013-04-26 MED ORDER — ONDANSETRON HCL 4 MG/2ML IJ SOLN: 4.0000 mg | Freq: Four times a day (QID) | INTRAMUSCULAR | Status: DC | PRN

## 2013-04-26 MED ORDER — MIDAZOLAM HCL 2 MG/2ML IJ SOLN
INTRAMUSCULAR | Status: AC
Start: 2013-04-26 — End: 2013-04-26
  Filled 2013-04-26: qty 2

## 2013-04-26 MED ORDER — FENTANYL CITRATE 0.05 MG/ML IJ SOLN
INTRAMUSCULAR | Status: AC
  Filled 2013-04-26: qty 5

## 2013-04-26 MED ORDER — CITALOPRAM HYDROBROMIDE 20 MG PO TABS
20.0000 mg | ORAL_TABLET | Freq: Every day | ORAL | Status: DC
  Administered 2013-04-27 – 2013-04-28 (×2): 20 mg via ORAL
  Filled 2013-04-26 (×2): qty 1

## 2013-04-26 MED ORDER — BUPIVACAINE LIPOSOME 1.3 % IJ SUSP
20.0000 mL | Freq: Once | INTRAMUSCULAR | Status: DC
  Filled 2013-04-26: qty 20

## 2013-04-26 MED ORDER — ACETAMINOPHEN 325 MG PO TABS
650.0000 mg | ORAL_TABLET | Freq: Four times a day (QID) | ORAL | Status: DC | PRN
  Administered 2013-04-26 – 2013-04-27 (×3): 650 mg via ORAL
  Filled 2013-04-26 (×2): qty 2

## 2013-04-26 MED ORDER — ONDANSETRON HCL 4 MG PO TABS: 4.0000 mg | ORAL_TABLET | Freq: Four times a day (QID) | ORAL | Status: DC | PRN

## 2013-04-26 MED ORDER — PROPOFOL 10 MG/ML IV BOLUS
INTRAVENOUS | Status: DC | PRN
  Administered 2013-04-26: 200 mg via INTRAVENOUS

## 2013-04-26 MED ORDER — LEVOTHYROXINE SODIUM 175 MCG PO TABS
175.0000 ug | ORAL_TABLET | Freq: Every day | ORAL | Status: DC
  Administered 2013-04-27 – 2013-04-28 (×2): 175 ug via ORAL
  Filled 2013-04-26 (×3): qty 1

## 2013-04-26 MED ORDER — METHOCARBAMOL 500 MG PO TABS
ORAL_TABLET | ORAL | Status: AC
  Filled 2013-04-26: qty 1

## 2013-04-26 MED ORDER — TRANEXAMIC ACID 100 MG/ML IV SOLN
1000.0000 mg | INTRAVENOUS | Status: AC
  Administered 2013-04-26: 1000 mg via INTRAVENOUS
  Filled 2013-04-26: qty 10

## 2013-04-26 MED ORDER — ONDANSETRON HCL 4 MG/2ML IJ SOLN
INTRAMUSCULAR | Status: AC
  Filled 2013-04-26: qty 2

## 2013-04-26 MED ORDER — ALUM & MAG HYDROXIDE-SIMETH 200-200-20 MG/5ML PO SUSP: 30.0000 mL | ORAL | Status: DC | PRN

## 2013-04-26 MED ORDER — METOCLOPRAMIDE HCL 10 MG PO TABS: 5.0000 mg | ORAL_TABLET | Freq: Three times a day (TID) | ORAL | Status: DC | PRN

## 2013-04-26 MED ORDER — METHOCARBAMOL 500 MG PO TABS
500.0000 mg | ORAL_TABLET | Freq: Four times a day (QID) | ORAL | Status: DC | PRN
  Administered 2013-04-26 – 2013-04-28 (×8): 500 mg via ORAL
  Filled 2013-04-26 (×8): qty 1

## 2013-04-26 MED ORDER — SODIUM CHLORIDE 0.9 % IJ SOLN
INTRAMUSCULAR | Status: DC | PRN
  Administered 2013-04-26: 12:00:00

## 2013-04-26 MED ORDER — OXYCODONE HCL 5 MG PO TABS
15.0000 mg | ORAL_TABLET | ORAL | Status: DC | PRN
  Administered 2013-04-26: 15 mg via ORAL
  Administered 2013-04-26 (×2): 20 mg via ORAL
  Administered 2013-04-27: 15 mg via ORAL
  Administered 2013-04-27: 20 mg via ORAL
  Filled 2013-04-26: qty 4
  Filled 2013-04-26 (×2): qty 3
  Filled 2013-04-26: qty 4

## 2013-04-26 MED ORDER — ESTRADIOL 2 MG PO TABS
2.0000 mg | ORAL_TABLET | Freq: Every day | ORAL | Status: DC
  Administered 2013-04-27 – 2013-04-28 (×2): 2 mg via ORAL
  Filled 2013-04-26 (×3): qty 1

## 2013-04-26 MED ORDER — SODIUM CHLORIDE 0.9 % IJ SOLN
INTRAMUSCULAR | Status: AC
  Filled 2013-04-26: qty 10

## 2013-04-26 MED ORDER — CALCIUM CARBONATE ANTACID 750 MG PO CHEW: 3.0000 | CHEWABLE_TABLET | Freq: Every day | ORAL | Status: DC | PRN

## 2013-04-26 MED ORDER — PROPOFOL 10 MG/ML IV BOLUS
INTRAVENOUS | Status: AC
  Filled 2013-04-26: qty 20

## 2013-04-26 MED ORDER — SODIUM CHLORIDE 0.9 % IR SOLN
Status: DC | PRN
  Administered 2013-04-26: 2000 mL

## 2013-04-26 MED ORDER — FERROUS SULFATE 325 (65 FE) MG PO TABS
325.0000 mg | ORAL_TABLET | Freq: Every day | ORAL | Status: DC
  Administered 2013-04-27 – 2013-04-28 (×2): 325 mg via ORAL
  Filled 2013-04-26 (×3): qty 1

## 2013-04-26 MED ORDER — ADULT MULTIVITAMIN W/MINERALS CH
1.0000 | ORAL_TABLET | Freq: Every day | ORAL | Status: DC
  Administered 2013-04-27: 1 via ORAL
  Filled 2013-04-26 (×3): qty 1

## 2013-04-26 MED ORDER — DOCUSATE SODIUM 100 MG PO CAPS
200.0000 mg | ORAL_CAPSULE | Freq: Every day | ORAL | Status: DC
  Administered 2013-04-27 – 2013-04-28 (×2): 200 mg via ORAL
  Filled 2013-04-26 (×2): qty 2

## 2013-04-26 MED ORDER — FENTANYL CITRATE 0.05 MG/ML IJ SOLN
25.0000 ug | INTRAMUSCULAR | Status: DC | PRN
Start: 2013-04-26 — End: 2013-04-26

## 2013-04-26 MED ORDER — VITAMIN B-12 1000 MCG PO TABS
5000.0000 ug | ORAL_TABLET | Freq: Every day | ORAL | Status: DC
  Administered 2013-04-27 – 2013-04-28 (×2): 5000 ug via ORAL
  Filled 2013-04-26 (×3): qty 5

## 2013-04-26 MED ORDER — MIDAZOLAM HCL 2 MG/2ML IJ SOLN
2.0000 mg | Freq: Once | INTRAMUSCULAR | Status: AC
  Administered 2013-04-26: 2 mg via INTRAVENOUS

## 2013-04-26 MED ORDER — ALPRAZOLAM 0.5 MG PO TABS
2.0000 mg | ORAL_TABLET | Freq: Every day | ORAL | Status: DC
  Administered 2013-04-26 – 2013-04-27 (×2): 2 mg via ORAL
  Filled 2013-04-26 (×2): qty 4

## 2013-04-26 MED ORDER — SODIUM CHLORIDE 0.9 % IR SOLN
Status: DC | PRN
  Administered 2013-04-26: 1000 mL

## 2013-04-26 MED ORDER — ALPRAZOLAM 0.5 MG PO TABS
1.0000 mg | ORAL_TABLET | Freq: Every day | ORAL | Status: DC | PRN
  Administered 2013-04-28: 1 mg via ORAL
  Filled 2013-04-26: qty 2

## 2013-04-26 SURGICAL SUPPLY — 75 items
APL SKNCLS STERI-STRIP NONHPOA (GAUZE/BANDAGES/DRESSINGS) ×1
BANDAGE ELASTIC 4 VELCRO ST LF (GAUZE/BANDAGES/DRESSINGS) ×3 IMPLANT
BANDAGE ELASTIC 6 VELCRO ST LF (GAUZE/BANDAGES/DRESSINGS) ×2 IMPLANT
BANDAGE ESMARK 6X9 LF (GAUZE/BANDAGES/DRESSINGS) ×1 IMPLANT
BANDAGE GAUZE ELAST BULKY 4 IN (GAUZE/BANDAGES/DRESSINGS) ×4 IMPLANT
BENZOIN TINCTURE PRP APPL 2/3 (GAUZE/BANDAGES/DRESSINGS) ×2 IMPLANT
BLADE SAGITTAL 25.0X1.19X90 (BLADE) ×2 IMPLANT
BLADE SAGITTAL 25.0X1.19X90MM (BLADE) ×1
BLADE SURG ROTATE 9660 (MISCELLANEOUS) IMPLANT
BNDG CMPR 9X6 STRL LF SNTH (GAUZE/BANDAGES/DRESSINGS) ×1
BNDG CMPR MED 10X6 ELC LF (GAUZE/BANDAGES/DRESSINGS) ×1
BNDG ELASTIC 6X10 VLCR STRL LF (GAUZE/BANDAGES/DRESSINGS) ×3 IMPLANT
BNDG ESMARK 6X9 LF (GAUZE/BANDAGES/DRESSINGS) ×3
BOWL SMART MIX CTS (DISPOSABLE) ×3 IMPLANT
CAP UPCHARGE REVISION TRAY ×2 IMPLANT
CAPT RP KNEE ×2 IMPLANT
CEMENT HV SMART SET (Cement) ×6 IMPLANT
CLOSURE STERI-STRIP 1/2X4 (GAUZE/BANDAGES/DRESSINGS) ×1
CLOTH BEACON ORANGE TIMEOUT ST (SAFETY) ×3 IMPLANT
CLSR STERI-STRIP ANTIMIC 1/2X4 (GAUZE/BANDAGES/DRESSINGS) ×1 IMPLANT
COVER SURGICAL LIGHT HANDLE (MISCELLANEOUS) ×3 IMPLANT
CUFF TOURNIQUET SINGLE 34IN LL (TOURNIQUET CUFF) ×3 IMPLANT
CUFF TOURNIQUET SINGLE 44IN (TOURNIQUET CUFF) IMPLANT
DRAPE EXTREMITY T 121X128X90 (DRAPE) ×3 IMPLANT
DRAPE PROXIMA HALF (DRAPES) ×3 IMPLANT
DRAPE U-SHAPE 47X51 STRL (DRAPES) ×3 IMPLANT
DRSG ADAPTIC 3X8 NADH LF (GAUZE/BANDAGES/DRESSINGS) ×3 IMPLANT
DRSG PAD ABDOMINAL 8X10 ST (GAUZE/BANDAGES/DRESSINGS) ×3 IMPLANT
DURAPREP 26ML APPLICATOR (WOUND CARE) ×3 IMPLANT
ELECT REM PT RETURN 9FT ADLT (ELECTROSURGICAL) ×3
ELECTRODE REM PT RTRN 9FT ADLT (ELECTROSURGICAL) ×1 IMPLANT
FACESHIELD LNG OPTICON STERILE (SAFETY) ×6 IMPLANT
GLOVE BIO SURGEON STRL SZ8.5 (GLOVE) ×3 IMPLANT
GLOVE BIOGEL PI IND STRL 7.5 (GLOVE) IMPLANT
GLOVE BIOGEL PI IND STRL 8 (GLOVE) ×1 IMPLANT
GLOVE BIOGEL PI IND STRL 8.5 (GLOVE) ×1 IMPLANT
GLOVE BIOGEL PI INDICATOR 7.5 (GLOVE) ×2
GLOVE BIOGEL PI INDICATOR 8 (GLOVE) ×2
GLOVE BIOGEL PI INDICATOR 8.5 (GLOVE) ×2
GLOVE SS BIOGEL STRL SZ 8 (GLOVE) ×1 IMPLANT
GLOVE SUPERSENSE BIOGEL SZ 8 (GLOVE) ×2
GLOVE SURG SS PI 7.0 STRL IVOR (GLOVE) ×2 IMPLANT
GOWN PREVENTION PLUS XLARGE (GOWN DISPOSABLE) ×3 IMPLANT
GOWN STRL NON-REIN LRG LVL3 (GOWN DISPOSABLE) ×3 IMPLANT
GOWN STRL REUS W/TWL 2XL LVL3 (GOWN DISPOSABLE) ×3 IMPLANT
HANDPIECE INTERPULSE COAX TIP (DISPOSABLE) ×3
HOOD PEEL AWAY FACE SHEILD DIS (HOOD) ×3 IMPLANT
IMMOBILIZER KNEE 20 (SOFTGOODS) IMPLANT
IMMOBILIZER KNEE 22 UNIV (SOFTGOODS) ×3 IMPLANT
IMMOBILIZER KNEE 24 THIGH 36 (MISCELLANEOUS) IMPLANT
IMMOBILIZER KNEE 24 UNIV (MISCELLANEOUS) ×3
KIT BASIN OR (CUSTOM PROCEDURE TRAY) ×3 IMPLANT
KIT ROOM TURNOVER OR (KITS) ×3 IMPLANT
MANIFOLD NEPTUNE II (INSTRUMENTS) ×3 IMPLANT
NDL HYPO 21X1 ECLIPSE (NEEDLE) ×1 IMPLANT
NEEDLE HYPO 21X1 ECLIPSE (NEEDLE) ×3 IMPLANT
NS IRRIG 1000ML POUR BTL (IV SOLUTION) ×3 IMPLANT
PACK TOTAL JOINT (CUSTOM PROCEDURE TRAY) ×3 IMPLANT
PAD ARMBOARD 7.5X6 YLW CONV (MISCELLANEOUS) ×6 IMPLANT
SET HNDPC FAN SPRY TIP SCT (DISPOSABLE) ×1 IMPLANT
SPONGE GAUZE 4X4 12PLY (GAUZE/BANDAGES/DRESSINGS) ×3 IMPLANT
SPONGE GAUZE 4X4 12PLY STER LF (GAUZE/BANDAGES/DRESSINGS) ×2 IMPLANT
STAPLER VISISTAT 35W (STAPLE) IMPLANT
SUCTION FRAZIER TIP 10 FR DISP (SUCTIONS) IMPLANT
SUT MNCRL AB 3-0 PS2 18 (SUTURE) IMPLANT
SUT VIC AB 0 CT1 27 (SUTURE) ×6
SUT VIC AB 0 CT1 27XBRD ANBCTR (SUTURE) ×2 IMPLANT
SUT VIC AB 2-0 CT1 27 (SUTURE) ×6
SUT VIC AB 2-0 CT1 TAPERPNT 27 (SUTURE) ×2 IMPLANT
SUT VLOC 180 0 24IN GS25 (SUTURE) ×3 IMPLANT
SYR 50ML LL SCALE MARK (SYRINGE) ×3 IMPLANT
TOWEL OR 17X24 6PK STRL BLUE (TOWEL DISPOSABLE) ×3 IMPLANT
TOWEL OR 17X26 10 PK STRL BLUE (TOWEL DISPOSABLE) ×3 IMPLANT
TRAY FOLEY CATH 14FR (SET/KITS/TRAYS/PACK) ×3 IMPLANT
WATER STERILE IRR 1000ML POUR (IV SOLUTION) ×6 IMPLANT

## 2013-04-26 NOTE — Preoperative (Signed)
Beta Blockers   Reason not to administer Beta Blockers:Not Applicable 

## 2013-04-26 NOTE — Transfer of Care (Signed)
Immediate Anesthesia Transfer of Care Note  Patient: Cynthia Barker  Procedure(s) Performed: Procedure(s): TOTAL KNEE ARTHROPLASTY (Left)  Patient Location: PACU  Anesthesia Type:General  Level of Consciousness: awake, alert  and oriented  Airway & Oxygen Therapy: Patient Spontanous Breathing and Patient connected to nasal cannula oxygen  Post-op Assessment: Report given to PACU RN, Post -op Vital signs reviewed and stable and Patient moving all extremities X 4  Post vital signs: Reviewed and stable  Complications: No apparent anesthesia complications

## 2013-04-26 NOTE — Anesthesia Preprocedure Evaluation (Addendum)
Anesthesia Evaluation  Patient identified by MRN, date of birth, ID band Patient awake    Reviewed: Allergy & Precautions, H&P , NPO status , Patient's Chart, lab work & pertinent test results  Airway Mallampati: II TM Distance: >3 FB     Dental  (+) Teeth Intact, Dental Advisory Given   Pulmonary Current Smoker,  breath sounds clear to auscultation        Cardiovascular Rhythm:Regular Rate:Normal     Neuro/Psych    GI/Hepatic GERD-  ,  Endo/Other  Hypothyroidism   Renal/GU      Musculoskeletal  (+) Fibromyalgia -  Abdominal (+) + obese,   Peds  Hematology   Anesthesia Other Findings   Reproductive/Obstetrics                          Anesthesia Physical Anesthesia Plan  ASA: II  Anesthesia Plan: General   Post-op Pain Management:    Induction: Intravenous  Airway Management Planned: LMA  Additional Equipment:   Intra-op Plan:   Post-operative Plan: Extubation in OR  Informed Consent: I have reviewed the patients History and Physical, chart, labs and discussed the procedure including the risks, benefits and alternatives for the proposed anesthesia with the patient or authorized representative who has indicated his/her understanding and acceptance.   Dental advisory given  Plan Discussed with: CRNA and Anesthesiologist  Anesthesia Plan Comments: (Plan GA with LMA and Adductor canal block  Kipp Broodavid Florinda Taflinger)       Anesthesia Quick Evaluation

## 2013-04-26 NOTE — Anesthesia Procedure Notes (Addendum)
Anesthesia Regional Block:  Adductor canal block  Pre-Anesthetic Checklist: ,, timeout performed, Correct Patient, Correct Site, Correct Laterality, Correct Procedure, Correct Position, site marked, Risks and benefits discussed,  Surgical consent,  Pre-op evaluation,  At surgeon's request and post-op pain management  Laterality: Left  Prep: chloraprep       Needles:  Injection technique: Single-shot  Needle Type: Echogenic Stimulator Needle     Needle Length: 9cm 9 cm Needle Gauge: 22 and 22 G    Additional Needles:  Procedures: ultrasound guided (picture in chart) Adductor canal block Narrative:  Start time: 04/26/2013 10:20 AM End time: 04/26/2013 10:25 AM Injection made incrementally with aspirations every 5 mL.  Performed by: Personally   Additional Notes: 30 cc 0.5% marcaine with 1:200 Epi and 8 mg decadron injected easily.     Procedure Name: LMA Insertion Date/Time: 04/26/2013 10:33 AM Performed by: Quentin OreWALKER, Tinna Kolker E Pre-anesthesia Checklist: Patient identified, Emergency Drugs available, Suction available, Patient being monitored and Timeout performed Patient Re-evaluated:Patient Re-evaluated prior to inductionOxygen Delivery Method: Circle system utilized Preoxygenation: Pre-oxygenation with 100% oxygen Intubation Type: IV induction Ventilation: Mask ventilation without difficulty LMA: LMA inserted LMA Size: 4.0 Number of attempts: 1 Placement Confirmation: positive ETCO2 and breath sounds checked- equal and bilateral Tube secured with: Tape Dental Injury: Teeth and Oropharynx as per pre-operative assessment

## 2013-04-26 NOTE — Op Note (Signed)
PREOP DIAGNOSIS: DJD LEFT KNEE POSTOP DIAGNOSIS:  same PROCEDURE: LEFT TKR ANESTHESIA: General and block ATTENDING SURGEON: Lateisha Thurlow G ASSISTANT: Lindwood Qua PA and Elodia Florence PA  INDICATIONS FOR PROCEDURE: Cynthia Barker is a 48 y.o. female who has struggled for a long time with pain due to degenerative arthritis of the left knee.  The patient has failed many conservative non-operative measures and at this point has pain which limits the ability to sleep and walk.  The patient is offered total knee replacement.  Informed operative consent was obtained after discussion of possible risks of anesthesia, infection, neurovascular injury, DVT, and death.  The importance of the post-operative rehabilitation protocol to optimize result was stressed extensively with the patient.  SUMMARY OF FINDINGS AND PROCEDURE:  Cynthia Barker was taken to the operative suite where under the above anesthesia a left knee replacement was performed.  There were advanced degenerative changes and the bone quality was excellent.  We used the DePuy system and placed size standard plus femur, tibia, 38 mm all polyethylene patella, and a size 10 mm spacer.  The patient was admitted for appropriate post-op care to include perioperative antibiotics and mechanical and pharmacologic measures for DVT prophylaxis.  DESCRIPTION OF PROCEDURE:  Cynthia Barker was taken to the operative suite where the above anesthesia was applied.  The patient was positioned supine and prepped and draped in normal sterile fashion.  An appropriate time out was performed.  After the administration of vancomycin pre-op antibiotic the leg was elevated and exsanguinated and a tourniquet inflated.  A standard longitudinal incision was made on the anterior knee.  Dissection was carried down to the extensor mechanism.  All appropriate anti-infective measures were used including the pre-operative antibiotic, betadine impregnated drape, and closed  hooded exhaust systems for each member of the surgical team.  A medial parapatellar incision was made in the extensor mechanism and the knee cap flipped and the knee flexed.  Some residual meniscal tissues were removed along with any remaining ACL/PCL tissue.  A guide was placed on the tibia and a flat cut was made on it's superior surface.  An intramedullary guide was placed in the femur and was utilized to make anterior and posterior cuts creating an appropriate flexion gap.  A second intramedullary guide was placed in the femur to make a distal cut properly balancing the knee with an extension gap equal to the flexion gap.  The three bones sized to the above mentioned sizes and the appropriate guides were placed and utilized.  A trial reduction was done and the knee easily came to full extension and the patella tracked well on flexion.  The trial components were removed and all bones were cleaned with pulsatile lavage and then dried thoroughly.  Cement was mixed and was pressurized onto the bones followed by placement of the aforementioned components.  Excess cement was trimmed and pressure was held on the components until the cement had hardened.  The tourniquet was deflated and a small amount of bleeding was controlled with cautery and pressure.  The knee was irrigated thoroughly.  The extensor mechanism was re-approximated with V-loc suture in running fashion.  The knee was flexed and the repair was solid.  The subcutaneous tissues were re-approximated with #0 and #2-0 vicryl and the skin closed with a subcuticular stitch and steristrips.  A sterile dressing was applied.  Intraoperative fluids, EBL, and tourniquet time can be obtained from anesthesia records.  DISPOSITION:  The patient was taken  to recovery room in stable condition and admitted for appropriate post-op care to include peri-operative antibiotic and DVT prophylaxis with mechanical and pharmacologic measures.  Shanielle Correll G 04/26/2013,  12:01 PM

## 2013-04-26 NOTE — Interval H&P Note (Signed)
History and Physical Interval Note:  04/26/2013 9:29 AM  Cynthia Barker  has presented today for surgery, with the diagnosis of LEFT KNEE DEGENERATIVE JOINT DISEASE  The various methods of treatment have been discussed with the patient and family. After consideration of risks, benefits and other options for treatment, the patient has consented to  Procedure(s): TOTAL KNEE ARTHROPLASTY (Left) as a surgical intervention .  The patient's history has been reviewed, patient examined, no change in status, stable for surgery.  I have reviewed the patient's chart and labs.  Questions were answered to the patient's satisfaction.     Harjot Zavadil G

## 2013-04-26 NOTE — Progress Notes (Signed)
Orthopedic Tech Progress Note Patient Details:  Cynthia Barker 1965-07-28 [id number] CPM applied to Left LE with appropriate settings. No OHF available at this time for patient use.  CPM Left Knee CPM Left Knee: On Left Knee Flexion (Degrees): 60 Left Knee Extension (Degrees): 0   Asia R Thompson 04/26/2013, 1:28 PM

## 2013-04-26 NOTE — Anesthesia Postprocedure Evaluation (Signed)
Anesthesia Post Note  Patient: Cynthia Barker  Procedure(s) Performed: Procedure(s) (LRB): TOTAL KNEE ARTHROPLASTY (Left)  Anesthesia type: general  Patient location: PACU  Post pain: Pain level controlled  Post assessment: Patient's Cardiovascular Status Stable  Post vital signs: Reviewed and stable  Level of consciousness: sedated  Complications: No apparent anesthesia complications

## 2013-04-26 NOTE — Progress Notes (Signed)
Orthopedic Tech Progress Note Patient Details:  Cynthia Barker 07/15/1965 [id number] On cpm at 8:20 pm LLE 0-60 Patient ID: [name], female   DOB: 10/16/1965, 48 y.o.   MRN: [id number]   Jennye MoccasinHughes, [place] Rommel Craig 04/26/2013, 8:20 PM

## 2013-04-27 ENCOUNTER — Encounter (HOSPITAL_COMMUNITY): Payer: Self-pay | Admitting: Orthopaedic Surgery

## 2013-04-27 LAB — BASIC METABOLIC PANEL
BUN: 8 mg/dL (ref 6–23)
CHLORIDE: 100 meq/L (ref 96–112)
CO2: 29 mEq/L (ref 19–32)
CREATININE: 0.69 mg/dL (ref 0.50–1.10)
Calcium: 8.6 mg/dL (ref 8.4–10.5)
GFR calc Af Amer: >90 mL/min (ref >90)
Glucose, Bld: 118 mg/dL — ABNORMAL HIGH (ref 70–99)
Potassium: 4.3 mEq/L (ref 3.7–5.3)
Sodium: 138 mEq/L (ref 137–147)

## 2013-04-27 LAB — CBC
HEMATOCRIT: 36.2 % (ref 36.0–46.0)
Hemoglobin: 12.3 g/dL (ref 12.0–15.0)
MCH: 32.2 pg (ref 26.0–34.0)
MCHC: 34 g/dL (ref 30.0–36.0)
MCV: 94.8 fL (ref 78.0–100.0)
PLATELETS: 341 10*3/uL (ref 150–400)
RBC: 3.82 MIL/uL — ABNORMAL LOW (ref 3.87–5.11)
RDW: 13.9 % (ref 11.5–15.5)
WBC: 18 10*3/uL — ABNORMAL HIGH (ref 4.0–10.5)

## 2013-04-27 MED ORDER — OXYCODONE HCL 5 MG PO TABS
20.0000 mg | ORAL_TABLET | ORAL | Status: DC | PRN
  Administered 2013-04-27: 25 mg via ORAL
  Administered 2013-04-27: 20 mg via ORAL
  Administered 2013-04-28 (×4): 25 mg via ORAL
  Filled 2013-04-27 (×3): qty 5
  Filled 2013-04-27: qty 4
  Filled 2013-04-27 (×2): qty 5

## 2013-04-27 NOTE — Progress Notes (Signed)
Subjective: 1 Day Post-Op Procedure(s) (LRB): TOTAL KNEE ARTHROPLASTY (Left)  Activity level:  WBAT Diet tolerance:  OK Voiding:  well Patient reports pain as 3 on 0-10 scale.    Objective: Vital signs in last 24 hours: Temp:  [97.3 F (36.3 C)-98.3 F (36.8 C)] 97.9 F (36.6 C) (02/25 0615) Pulse Rate:  [63-93] 72 (02/25 0615) Resp:  [10-22] 18 (02/25 0615) BP: (121-163)/(71-95) 139/83 mmHg (02/25 0615) SpO2:  [89 %-97 %] 91 % (02/25 0615) Weight:  [94.348 kg (208 lb)] 94.348 kg (208 lb) (02/25 0026)  Labs:  Recent Labs  04/27/13 0439  HGB 12.3    Recent Labs  04/27/13 0439  WBC 18.0*  RBC 3.82*  HCT 36.2  PLT 341    Recent Labs  04/27/13 0439  NA 138  K 4.3  CL 100  CO2 29  BUN 8  CREATININE 0.69  GLUCOSE 118*  CALCIUM 8.6   No results found for this basename: LABPT, INR,  in the last 72 hours  Physical Exam:  Neurologically intact ABD soft Neurovascular intact Sensation intact distally Intact pulses distally Dorsiflexion/Plantar flexion intact Incision: dressing C/D/I  Assessment/Plan:  1 Day Post-Op Procedure(s) (LRB): TOTAL KNEE ARTHROPLASTY (Left) Advance diet Up with therapy D/C IV fluids Plan for discharge tomorrow Discharge home with home health  Continue ASA 325mg  BID for 2 weeks     Toniyah Dilmore R 04/27/2013, 8:09 AM

## 2013-04-27 NOTE — Evaluation (Signed)
Occupational Therapy Evaluation Patient Details Name: Cynthia Barker MRN: [id number] DOB: Jun 15, 1965 Today's Date: 04/27/2013 Time: [id number] OT Time Calculation (min): 32 min  OT Assessment / Plan / Recommendation History of present illness pt s/p L TKA   Clinical Impression   Pt demos decline in function with ADLs and ADL mobility safety and would benefit from acute OT services to address impairments to increase level of function and safety to return home.    OT Assessment  Patient needs continued OT Services    Follow Up Recommendations  Home health OT    Barriers to Discharge   none  Equipment Recommendations  3 in 1 bedside comode;Tub/shower bench    Recommendations for Other Services    Frequency  Min 2X/week    Precautions / Restrictions Precautions Precautions: Knee Required Braces or Orthoses: Knee Immobilizer - Left Knee Immobilizer - Left: Discontinue once straight leg raise with < 10 degree lag Restrictions Weight Bearing Restrictions: Yes LLE Weight Bearing: Weight bearing as tolerated   Pertinent Vitals/Pain 8/10 L knee pain. Pt reports that pain will not go lower than 8/10 and that she believes pain meds not strong enough    ADL  Grooming: Performed;Wash/dry hands;Wash/dry face;Min guard;Brushing hair Where Assessed - Grooming: Supported standing Upper Body Bathing: Simulated;Supervision/safety;Set up Where Assessed - Upper Body Bathing: Unsupported sitting Lower Body Bathing: Simulated;Moderate assistance Upper Body Dressing: Performed;Supervision/safety;Set up Where Assessed - Upper Body Dressing: Unsupported sitting Lower Body Dressing: Maximal assistance;Simulated;Performed Toilet Transfer: Performed;Supervision/safety;Min guard Toilet Transfer Method: Sit to stand Toilet Transfer Equipment: Raised toilet seat with arms (or 3-in-1 over toilet) Toileting - Clothing Manipulation and Hygiene: Performed;Minimal assistance Where Assessed - International aid/development workerToileting  Clothing Manipulation and Hygiene: Standing Tub/Shower Transfer Method: Not assessed Equipment Used: Long-handled shoe horn;Long-handled sponge;Reacher;Gait belt;Rolling walker;Knee Immobilizer;Sock aid;Other (comment) (3 in 1 over toilet, leg lifter) Transfers/Ambulation Related to ADLs: cues for hand placement, safe technique ADL Comments: Pt provided with education and demo of ADL A/E, leg lifter and tub bench    OT Diagnosis: Acute pain  OT Problem List: Decreased knowledge of use of DME or AE;Decreased activity tolerance;Impaired balance (sitting and/or standing);Pain OT Treatment Interventions: Self-care/ADL training;Therapeutic exercise;Patient/family education;Neuromuscular education;Balance training;Therapeutic activities;DME and/or AE instruction   OT Goals(Current goals can be found in the care plan section) Acute Rehab OT Goals Patient Stated Goal: get home OT Goal Formulation: With patient Time For Goal Achievement: 05/04/13 Potential to Achieve Goals: Good ADL Goals Pt Will Perform Grooming: with set-up;with supervision;standing Pt Will Perform Lower Body Bathing: with min assist;with caregiver independent in assisting;with adaptive equipment;sitting/lateral leans;sit to/from stand Pt Will Perform Lower Body Dressing: with mod assist;with min assist;sitting/lateral leans;sit to/from stand;with caregiver independent in assisting;with adaptive equipment Pt Will Transfer to Toilet: with supervision;with modified independence;ambulating;regular height toilet;grab bars (3 in 1 over toilet) Pt Will Perform Toileting - Clothing Manipulation and hygiene: with min guard assist;with supervision;sitting/lateral leans;sit to/from stand Pt Will Perform Tub/Shower Transfer: with min guard assist;with supervision;tub bench  Visit Information  Last OT Received On: 04/27/13 Assistance Needed: +1 Reason Eval/Treat Not Completed: Pain limiting ability to participate History of Present Illness:  pt s/p L TKA       Prior Functioning     Home Living Family/patient expects to be discharged to:: Private residence Living Arrangements: Children Available Help at Discharge: Family Type of Home: House Home Access: Stairs to enter Secretary/administratorntrance Stairs-Number of Steps: 5 Entrance Stairs-Rails: Can reach both Home Layout: Two level Alternate Level Stairs-Number of Steps: flight Alternate Level  Stairs-Rails: Right Home Equipment: Walker - 2 wheels;Bedside commode Additional Comments: will borrow tub bench from a friend per pt Prior Function Level of Independence: Independent Communication Communication: No difficulties Dominant Hand: Right         Vision/Perception Vision - History Baseline Vision: Wears glasses all the time Patient Visual Report: No change from baseline Perception Perception: Within Functional Limits   Cognition  Cognition Arousal/Alertness: Awake/alert Behavior During Therapy: WFL for tasks assessed/performed Overall Cognitive Status: Within Functional Limits for tasks assessed    Extremity/Trunk Assessment Upper Extremity Assessment Upper Extremity Assessment: Overall WFL for tasks assessed Lower Extremity Assessment Lower Extremity Assessment: Defer to PT evaluation Cervical / Trunk Assessment Cervical / Trunk Assessment: Normal     Mobility Bed Mobility Bed Mobility: Sit to Supine Supine to sit: Supervision Sit to supine: Min guard General bed mobility comments: min guard for L LE Transfers Overall transfer level: Needs assistance Equipment used: Rolling walker (2 wheeled) Transfers: Sit to/from Stand Sit to Stand: Supervision General transfer comment: cues for hand placement, safe technique        Balance Balance Overall balance assessment: Needs assistance Sitting-balance support: No upper extremity supported;Feet supported Sitting balance-Leahy Scale: Good Standing balance support: Single extremity supported;Bilateral upper  extremity supported;During functional activity Standing balance comment: min guard A   End of Session OT - End of Session Equipment Utilized During Treatment: Gait belt;Rolling walker;Other (comment);Left knee immobilizer (3 in 1 over toilet, ADL A/E, leg lifter) Activity Tolerance: Patient tolerated treatment well Patient left: in bed;with call bell/phone within reach CPM Left Knee CPM Left Knee: Off  GO     Margaretmary Eddy [facility] 04/27/2013, 2:54 PM

## 2013-04-27 NOTE — Progress Notes (Signed)
Orthopedic Tech Progress Note Patient Details:  Cynthia Barker 07-25-1965 [id number]  Patient ID: Cynthia Barker, female   DOB: 07-25-1965, 48 y.o.   MRN: [id number] Placed pt's lle in cpm @ 0-60 degrees @1445   [name], Uniqua Kihn 04/27/2013, 2:47 PM

## 2013-04-27 NOTE — Progress Notes (Signed)
Physical Therapy Treatment Patient Details Name: Cynthia Barker MRN: [id number] DOB: 1965-10-05 Today's Date: 04/27/2013 Time: [id number] PT Time Calculation (min): 23 min  PT Assessment / Plan / Recommendation  History of Present Illness pt s/p L TKA   PT Comments   Pt able to perform supine to sit with supervision, stairs with min guard for safety.  Will benefit from increased gait and stair practice and increasing activity tolerance.  Follow Up Recommendations  Home health PT     Does the patient have the potential to tolerate intense rehabilitation     Barriers to Discharge        Equipment Recommendations  Rolling walker with 5" wheels    Recommendations for Other Services    Frequency 7X/week   Progress towards PT Goals Progress towards PT goals: Progressing toward goals  Plan Current plan remains appropriate    Precautions / Restrictions Precautions Precautions: Knee Required Braces or Orthoses: Knee Immobilizer - Left Knee Immobilizer - Left: Discontinue once straight leg raise with < 10 degree lag Restrictions LLE Weight Bearing: Weight bearing as tolerated   Pertinent Vitals/Pain Pt c/o L knee pain, meds given    Mobility  Bed Mobility Overal bed mobility: Needs Assistance Bed Mobility: Supine to Sit Supine to sit: Supervision Sit to supine: Min guard General bed mobility comments: min guard for L LE Transfers Overall transfer level: Needs assistance Equipment used: Rolling walker (2 wheeled) Transfers: Sit to/from Stand Sit to Stand: Supervision General transfer comment: cues for LE and UE placement Ambulation/Gait Ambulation/Gait assistance: Min guard Ambulation Distance (Feet): 70 Feet Assistive device: Rolling walker (2 wheeled) Gait velocity interpretation: Below normal speed for age/gender General Gait Details: cues for step through pattern, no LOB Stairs: Yes Stairs assistance: Min guard Stair Management: Two rails;Step to pattern Number of  Stairs: 5 General stair comments: pt simulated 1 hand on rail, 1 hand on wall as this is what she has at home.  pt with good safety wtih stairs, states she has been using step to pattern for some time due to pain    Exercises Total Joint Exercises Ankle Circles/Pumps: AROM;Both;20 reps Quad Sets: AROM;Left;10 reps Gluteal Sets: AROM;Both;20 reps   PT Diagnosis: Difficulty walking;Generalized weakness;Acute pain  PT Problem List: Decreased strength;Decreased mobility;Decreased range of motion;Decreased activity tolerance;Decreased knowledge of use of DME;Pain PT Treatment Interventions: DME instruction;Therapeutic exercise;Wheelchair mobility training;Gait training;Balance training;Stair training;Neuromuscular re-education;Modalities;Functional mobility training;Therapeutic activities;Patient/family education   PT Goals (current goals can now be found in the care plan section) Acute Rehab PT Goals Patient Stated Goal: get home PT Goal Formulation: With patient Time For Goal Achievement: 05/04/13 Potential to Achieve Goals: Good  Visit Information  Last PT Received On: 04/27/13 Assistance Needed: +1 History of Present Illness: pt s/p L TKA    Subjective Data  Patient Stated Goal: get home   Cognition  Cognition Arousal/Alertness: Awake/alert Behavior During Therapy: WFL for tasks assessed/performed Overall Cognitive Status: Within Functional Limits for tasks assessed    Balance     End of Session PT - End of Session Equipment Utilized During Treatment: Left knee immobilizer Activity Tolerance: Patient tolerated treatment well Patient left: in chair (with OT) Nurse Communication: Mobility status   GP     DONAWERTH,KAREN 04/27/2013, 1:54 PM

## 2013-04-27 NOTE — Progress Notes (Signed)
OT Cancellation Note  Patient Details Name: Cynthia Barker MRN: [id number] DOB: 1965-08-25   Cancelled Treatment:    Reason Eval/Treat Not Completed: Pain limiting ability to participate. Pt reports 9/10 L knee pain and requests that OT return later. Will check back on pt this afternoon as time allows  Galen ManilaSpencer, Jawana Reagor Jeanette 04/27/2013, 11:19 AM

## 2013-04-27 NOTE — Progress Notes (Signed)
Pt continues to rank her pain at 7 or above. Is receiving all her pain medications. Pt is moving well and has walked several times to the BR with minimal assistance. Oxygen sats are at 88% without O2.  on at 2l and sats at 94%.

## 2013-04-27 NOTE — Evaluation (Signed)
Physical Therapy Evaluation Patient Details Name: Cynthia Barker Onken MRN: [id number] DOB: 19-Oct-1965 Today's Date: 04/27/2013 Time: [id number] PT Time Calculation (min): 20 min  PT Assessment / Plan / Recommendation History of Present Illness  pt s/p L TKA  Clinical Impression  Pt presents with decreased strength, ROM and impaired mobility.  Pt will benefit from skilled acute PT services to address deficits and increase functional independence.    PT Assessment  Patient needs continued PT services    Follow Up Recommendations  Home health PT    Does the patient have the potential to tolerate intense rehabilitation      Barriers to Discharge        Equipment Recommendations  Rolling walker with 5" wheels    Recommendations for Other Services     Frequency 7X/week    Precautions / Restrictions Precautions Precautions: Knee Required Braces or Orthoses: Knee Immobilizer - Left Knee Immobilizer - Left: Discontinue once straight leg raise with < 10 degree lag Restrictions Weight Bearing Restrictions: Yes LLE Weight Bearing: Weight bearing as tolerated   Pertinent Vitals/Pain Pt c/o 5/10 pain, eases with rest and repositioning      Mobility  Bed Mobility Overal bed mobility: Needs Assistance Bed Mobility: Sit to Supine Sit to supine: Min guard General bed mobility comments: min guard for L LE Transfers Overall transfer level: Needs assistance Equipment used: Rolling walker (2 wheeled) Transfers: Sit to/from Stand Sit to Stand: Min guard General transfer comment: cues for LE and UE placement Ambulation/Gait Ambulation/Gait assistance: Min guard Ambulation Distance (Feet): 70 Feet Assistive device: Rolling walker (2 wheeled) Gait velocity interpretation: Below normal speed for age/gender General Gait Details: cues for step through pattern, no LOB    Exercises     PT Diagnosis: Difficulty walking;Generalized weakness;Acute pain  PT Problem List: Decreased  strength;Decreased mobility;Decreased range of motion;Decreased activity tolerance;Decreased knowledge of use of DME;Pain PT Treatment Interventions: DME instruction;Therapeutic exercise;Wheelchair mobility training;Gait training;Balance training;Stair training;Neuromuscular re-education;Modalities;Functional mobility training;Therapeutic activities;Patient/family education     PT Goals(Current goals can be found in the care plan section) Acute Rehab PT Goals Patient Stated Goal: get home PT Goal Formulation: With patient Time For Goal Achievement: 05/04/13 Potential to Achieve Goals: Good  Visit Information  Last PT Received On: 04/27/13 Assistance Needed: +1 History of Present Illness: pt s/p L TKA       Prior Functioning  Home Living Family/patient expects to be discharged to:: Private residence Living Arrangements: Children Available Help at Discharge: Family Type of Home: House Home Access: Stairs to enter Secretary/administratorntrance Stairs-Number of Steps: 5 Entrance Stairs-Rails: Can reach both Home Layout: Two level Alternate Level Stairs-Number of Steps: flight Alternate Level Stairs-Rails: Right Home Equipment: Walker - 2 wheels Prior Function Level of Independence: Independent Communication Communication: No difficulties    Cognition  Cognition Arousal/Alertness: Awake/alert Behavior During Therapy: WFL for tasks assessed/performed Overall Cognitive Status: Within Functional Limits for tasks assessed    Extremity/Trunk Assessment Lower Extremity Assessment Lower Extremity Assessment: LLE deficits/detail LLE Deficits / Details: ROM limited by pain Cervical / Trunk Assessment Cervical / Trunk Assessment: Normal   Balance    End of Session PT - End of Session Activity Tolerance: Patient tolerated treatment well Patient left: in bed;with call bell/phone within reach Nurse Communication: Mobility status CPM Left Knee CPM Left Knee: Off  GP     DONAWERTH,KAREN 04/27/2013,  10:43 AM

## 2013-04-27 NOTE — Care Management Note (Signed)
CARE MANAGEMENT NOTE 04/27/2013  Patient:  Cynthia Barker,Cynthia Barker   Account Number:  [id number]  Date Initiated:  04/27/2013  Documentation initiated by:  Vance PeperBRADY,Daviyon Widmayer  Subjective/Objective Assessment:   48 yr old female s/p left total knee arthroplasty.     Action/Plan:   Case Manager spoke with patient concerning home health and DME needs at discharge. Preoperatively setup with [facility], no changes. DME has been delivered. Patient has family support at discharge.   Anticipated DC Date:  04/28/2013   Anticipated DC Plan:  HOME W HOME HEALTH SERVICES      DC Planning Services  CM consult      [facility]  DURABLE MEDICAL EQUIPMENT   Choice offered to / List presented to:  C-1 Patient   DME arranged  3-N-1  WALKER - ROLLING  CPM      DME agency  [employer]     HH arranged  HH-2 PT      [facility]   Status of service:  Completed, signed off Medicare Important Message given?   (If response is "NO", the following Medicare IM given date fields will be blank) Date Medicare IM given:   Date Additional Medicare IM given:    Discharge Disposition:  HOME W HOME HEALTH SERVICES  Per UR Regulation:

## 2013-04-27 NOTE — Discharge Instructions (Signed)
ASA 325mg  for 2 weeks Weightbearing as tolerated Follow up in clinic in 2 weeks Ice, elevation, and change dressing as needed   [facility] [phone]

## 2013-04-27 NOTE — Progress Notes (Signed)
Orthopedic Tech Progress Note Patient Details:  Cathlyn R Reppert 11-Jun-1965 [id number] On cpm at 7:50 pm LLE 0-60 Patient ID: Manning Charityoyce R Embleton, female   DOB: 11-Jun-1965, 48 y.o.   MRN: [id number]   Jennye MoccasinHughes, Dailyn Reith Craig 04/27/2013, 7:52 PM

## 2013-04-28 LAB — CBC
HEMATOCRIT: 33.4 % — AB (ref 36.0–46.0)
Hemoglobin: 11.4 g/dL — ABNORMAL LOW (ref 12.0–15.0)
MCH: 32.3 pg (ref 26.0–34.0)
MCHC: 34.1 g/dL (ref 30.0–36.0)
MCV: 94.6 fL (ref 78.0–100.0)
Platelets: 268 10*3/uL (ref 150–400)
RBC: 3.53 MIL/uL — ABNORMAL LOW (ref 3.87–5.11)
RDW: 13.9 % (ref 11.5–15.5)
WBC: 12.2 10*3/uL — ABNORMAL HIGH (ref 4.0–10.5)

## 2013-04-28 MED ORDER — OXYCODONE HCL 30 MG PO TABS: 30.0000 mg | ORAL_TABLET | ORAL | Status: AC | PRN

## 2013-04-28 MED ORDER — ASPIRIN 325 MG PO TBEC: 325.0000 mg | DELAYED_RELEASE_TABLET | Freq: Two times a day (BID) | ORAL | Status: AC

## 2013-04-28 MED ORDER — METHOCARBAMOL 500 MG PO TABS: 500.0000 mg | ORAL_TABLET | Freq: Four times a day (QID) | ORAL | Status: AC | PRN

## 2013-04-28 NOTE — Progress Notes (Signed)
OT Cancellation Note and Discharge  Patient Details Name: Cynthia Barker MRN: [id number] DOB: 01-18-1966   Cancelled Treatment:     In to see pt for OT treatment. Pt reports that family will assist with LB ADLs and pt is not concerned about toilet/tub transfers. Acute OT will sign off.  Rae LipsMiller, Jamesrobert Ohanesian [phone] 04/28/2013, 3:21 PM

## 2013-04-28 NOTE — Progress Notes (Signed)
Patient d/c to home, IV removed, prescriptions given and instructions reviewed. 

## 2013-04-28 NOTE — Progress Notes (Signed)
Physical Therapy Treatment Patient Details Name: MALITA IGNASIAK MRN: [id number] DOB: January 18, 1966 Today's Date: 04/28/2013 Time: 1022-1100 PT Time Calculation (min): 38 min  PT Assessment / Plan / Recommendation  History of Present Illness pt s/p L TKA   PT Comments   Pt slowly progressing towards physical therapy goals. Appeared lethargic at times, however RN notes pt has had all of her pain medications this morning. Will need to practice steps (>5 at a time) prior to d/c.   Follow Up Recommendations  Home health PT     Does the patient have the potential to tolerate intense rehabilitation     Barriers to Discharge        Equipment Recommendations  Rolling walker with 5" wheels    Recommendations for Other Services    Frequency 7X/week   Progress towards PT Goals Progress towards PT goals: Progressing toward goals  Plan Current plan remains appropriate    Precautions / Restrictions Precautions Precautions: Fall;Knee Precaution Comments: Discussed towel roll under heel and NO pillow under knee Required Braces or Orthoses: Knee Immobilizer - Left Knee Immobilizer - Left: Discontinue once straight leg raise with < 10 degree lag Restrictions Weight Bearing Restrictions: Yes LLE Weight Bearing: Weight bearing as tolerated   Pertinent Vitals/Pain 8-9/10 at rest - RN notified.     Mobility  Bed Mobility Overal bed mobility: Needs Assistance Bed Mobility: Supine to Sit Supine to sit: Supervision;HOB elevated General bed mobility comments: Pt cued to slow down to put knee immobilizer on prior to getting to EOB.  Transfers Overall transfer level: Needs assistance Equipment used: Rolling walker (2 wheeled) Transfers: Sit to/from Stand Sit to Stand: Supervision General transfer comment: cues for hand placement, safe technique Ambulation/Gait Ambulation/Gait assistance: Min guard Ambulation Distance (Feet): 75 Feet Assistive device: Rolling walker (2 wheeled) Gait  Pattern/deviations: Step-to pattern;Step-through pattern;Decreased stride length Gait velocity: Decreased Gait velocity interpretation: Below normal speed for age/gender General Gait Details: VC's for improved posture, and encouraged step-through gait pattern and fluidity of walker movement. Minimal corrective changes with cueing.  Stairs: Yes Stairs assistance: Min guard Stair Management: Two rails;Step to pattern;Forwards Number of Stairs: 5 General stair comments: pt simulated 1 hand on rail, 1 hand on wall as this is what she has at home.  pt with good safety wtih stairs, states she has been using step to pattern for some time due to pain    Exercises Total Joint Exercises Heel Slides: 5 reps (with static hold for range) Goniometric ROM: 67 AAROM   PT Diagnosis:    PT Problem List:   PT Treatment Interventions:     PT Goals (current goals can now be found in the care plan section) Acute Rehab PT Goals Patient Stated Goal: get home PT Goal Formulation: With patient Time For Goal Achievement: 05/04/13 Potential to Achieve Goals: Good  Visit Information  Last PT Received On: 04/28/13 Assistance Needed: +1 History of Present Illness: pt s/p L TKA    Subjective Data  Subjective: "I need to use the bathroom." Patient Stated Goal: get home   Cognition  Cognition Arousal/Alertness: Awake/alert Behavior During Therapy: WFL for tasks assessed/performed Overall Cognitive Status: Within Functional Limits for tasks assessed    Balance  Balance Overall balance assessment: Needs assistance Sitting-balance support: Feet supported;Bilateral upper extremity supported Sitting balance-Leahy Scale: Good Standing balance support: Bilateral upper extremity supported Standing balance-Leahy Scale: Fair General Comments General comments (skin integrity, edema, etc.): Therapeutic exercise limited by pain  End of Session PT -  End of Session Equipment Utilized During Treatment: Left knee  immobilizer;Gait belt Activity Tolerance: Patient limited by pain Patient left: in chair;with call bell/phone within reach Nurse Communication: Mobility status   GP     Ruthann CancerHamilton, Rasheem Figiel 04/28/2013, 12:28 PM  Ruthann CancerLaura Hamilton, PT, DPT [phone]

## 2013-04-28 NOTE — Progress Notes (Signed)
Subjective: 2 Days Post-Op Procedure(s) (LRB): TOTAL KNEE ARTHROPLASTY (Left)  Activity level:  Has been working well with therapy. Plan discharge after therapy today.  Diet tolerance:   okay  Voiding:  0ok Patient reports pain as 3 on 0-10 scale.    Objective: Vital signs in last 24 hours: Temp:  [97.9 F (36.6 C)-99.1 F (37.3 C)] 99.1 F (37.3 C) (02/26 0518) Pulse Rate:  [80-107] 107 (02/26 0518) Resp:  [18] 18 (02/26 0518) BP: (144-151)/(82-93) 151/93 mmHg (02/26 0518) SpO2:  [93 %-95 %] 93 % (02/26 0518)  Labs:  Recent Labs  04/27/13 0439 04/28/13 0510  HGB 12.3 11.4*    Recent Labs  04/27/13 0439 04/28/13 0510  WBC 18.0* 12.2*  RBC 3.82* 3.53*  HCT 36.2 33.4*  PLT 341 268    Recent Labs  04/27/13 0439  NA 138  K 4.3  CL 100  CO2 29  BUN 8  CREATININE 0.69  GLUCOSE 118*  CALCIUM 8.6   No results found for this basename: LABPT, INR,  in the last 72 hours  Physical Exam:  Neurologically intact ABD soft Neurovascular intact Sensation intact distally Intact pulses distally Dorsiflexion/Plantar flexion intact No cellulitis present Compartment soft  Assessment/Plan:  2 Days Post-Op Procedure(s) (LRB): TOTAL KNEE ARTHROPLASTY (Left) Advance diet Up with therapy Discharge home with home health after cleared by therapy today. Change dressing prior to discharge ASA 325 one twice a day x4 weeks. Prescriptions are on the chart. Return to office 2 weeks.     Cyrah Mclamb R 04/28/2013, 7:52 AM

## 2013-04-28 NOTE — Discharge Summary (Signed)
Patient ID: Cynthia Barker MRN: [id number] DOB/AGE: [birth year] 47 y.o.  Admit date: 04/26/2013 Discharge date: 04/28/2013  Admission Diagnoses:  Principal Problem:   Left knee DJD   Discharge Diagnoses:  Same  Past Medical History  Diagnosis Date  . Chronic pain   . DDD (degenerative disc disease)   . Anxiety     takes Xanax daily as needed  . GERD (gastroesophageal reflux disease)     takes Tums as needed  . Depression     takes Celexa and Cymbalta daily  . Constipation     takes Colace daily  . Hypothyroidism     takes Synthroid daily  . Insomnia     takes Ambien nightly  . Dry eyes     uses eye drops daily as needed  . Complication of anesthesia     woke up with 1st C/S(was put to sleep d/t emergency  . Panic attacks   . History of bronchitis     last time 4months ago  . History of migraine     last one summer 2014  . Joint pain   . Joint swelling   . Chronic back pain     stenosis  . Anemia     after partial hysterectomy  . Fibromyalgia     takes Cymbalta daily    Surgeries: Procedure(s): TOTAL KNEE ARTHROPLASTY on 04/26/2013   Consultants:    Discharged Condition: Improved  Hospital Course: Cynthia Barker is an 48 y.o. female who was admitted 04/26/2013 for operative treatment ofLeft knee DJD. Patient has severe unremitting pain that affects sleep, daily activities, and work/hobbies. After pre-op clearance the patient was taken to the operating room on 04/26/2013 and underwent  Procedure(s): TOTAL KNEE ARTHROPLASTY.    Patient was given perioperative antibiotics: Anti-infectives   Start     Dose/Rate Route Frequency Ordered Stop   04/26/13 2200  vancomycin (VANCOCIN) IVPB 1000 mg/200 mL premix     1,000 mg 200 mL/hr over 60 Minutes Intravenous Every 12 hours 04/26/13 1635 04/26/13 2245   04/26/13 0600  vancomycin (VANCOCIN) IVPB 1000 mg/200 mL premix     1,000 mg 200 mL/hr over 60 Minutes Intravenous On call to O.R. 04/25/13 1408 04/26/13 1135        Patient was given sequential compression devices, early ambulation, and chemoprophylaxis to prevent DVT.  Patient benefited maximally from hospital stay and there were no complications.    Recent vital signs: Patient Vitals for the past 24 hrs:  BP Temp Temp src Pulse Resp SpO2  04/28/13 0518 151/93 mmHg 99.1 F (37.3 C) Oral 107 18 93 %  04/27/13 2049 145/85 mmHg 98.3 F (36.8 C) Oral 101 18 95 %  04/27/13 1300 144/82 mmHg 97.9 F (36.6 C) - 80 18 95 %     Recent laboratory studies:  Recent Labs  04/27/13 0439 04/28/13 0510  WBC 18.0* 12.2*  HGB 12.3 11.4*  HCT 36.2 33.4*  PLT 341 268  NA 138  --   K 4.3  --   CL 100  --   CO2 29  --   BUN 8  --   CREATININE 0.69  --   GLUCOSE 118*  --   CALCIUM 8.6  --      Discharge Medications:     Medication List    STOP taking these medications       ALEVE 220 MG tablet  Generic drug:  naproxen sodium      TAKE these medications  ALPRAZolam 1 MG tablet  Commonly known as:  XANAX  Take 2 mg by mouth See admin instructions. Take 2 tablets (2 mg) daily at bedtime, may take an additional tablet during the day as needed for anxiety     aspirin 325 MG EC tablet  Take 1 tablet (325 mg total) by mouth 2 (two) times daily after a meal.     calcium carbonate 750 MG chewable tablet  Commonly known as:  TUMS EX  Chew 3 tablets by mouth daily as needed for heartburn (acid indigestion).     citalopram 20 MG tablet  Commonly known as:  CELEXA  Take 20 mg by mouth daily.     diphenhydrAMINE 25 MG tablet  Commonly known as:  BENADRYL  Take 25 mg by mouth 2 (two) times daily as needed for itching.     docusate sodium 100 MG capsule  Commonly known as:  COLACE  Take 200 mg by mouth daily.     DULoxetine 60 MG capsule  Commonly known as:  CYMBALTA  Take 60 mg by mouth daily.     estradiol 2 MG tablet  Commonly known as:  ESTRACE  Take 2 mg by mouth daily.     levothyroxine 175 MCG tablet  Commonly known as:   SYNTHROID, LEVOTHROID  Take 175 mcg by mouth daily before breakfast.     methocarbamol 500 MG tablet  Commonly known as:  ROBAXIN  Take 1 tablet (500 mg total) by mouth every 6 (six) hours as needed for muscle spasms.     multivitamin with minerals Tabs tablet  Take 1 tablet by mouth daily.     oxycodone 30 MG immediate release tablet  Commonly known as:  ROXICODONE  Take 1 tablet (30 mg total) by mouth every 4 (four) hours as needed for pain.     PROBIOTIC PO  Take 1 capsule by mouth at bedtime.     VISINE FOR CONTACTS Soln  Place 1 drop into both eyes daily as needed (dry eyes).     Vitamin B-12 5000 MCG Subl  Place 5,000 mcg under the tongue daily.     vitamin C 1000 MG tablet  Take 1,000 mg by mouth daily.     zolpidem 10 MG tablet  Commonly known as:  AMBIEN  Take 10 mg by mouth at bedtime.        Diagnostic Studies: Dg Chest 2 View  04/20/2013   CLINICAL DATA:  Preop for left knee replacement  EXAM: CHEST  2 VIEW  COMPARISON:  08/30/2010  FINDINGS: Cardiomediastinal silhouette is stable. No acute infiltrate or pleural effusion. No pulmonary edema. Mild degenerative changes thoracic spine.  IMPRESSION: No active cardiopulmonary disease.   Electronically Signed   By: Natasha MeadLiviu  Pop M.D.   On: 04/20/2013 16:06    Disposition: 01-Home or Self Care      Discharge Orders   Future Orders Complete By Expires   Call MD / Call 911  As directed    Comments:     If you experience chest pain or shortness of breath, CALL 911 and be transported to the hospital emergency room.  If you develope a fever above 101 F, pus (white drainage) or increased drainage or redness at the wound, or calf pain, call your surgeon's office.   Constipation Prevention  As directed    Comments:     Drink plenty of fluids.  Prune juice may be helpful.  You may use a stool softener, such as Colace (  over the counter) 100 mg twice a day.  Use MiraLax (over the counter) for constipation as needed.   Diet -  low sodium heart healthy  As directed    Increase activity slowly as tolerated  As directed       Follow-up Information   Follow up with Velna Ochs, MD In 2 weeks.   Specialty:  Orthopedic Surgery   Contact information:   [address] [place] [address] [phone]        Signed: Prince Rome 04/28/2013, 8:03 AM

## 2015-07-07 IMAGING — CR DG CHEST 2V
2 series · 2 of 2 positions shown · non-contrast
Comparison: 08/30/2010

CLINICAL DATA: Preop for left knee replacement

EXAM:
CHEST  2 VIEW

[w chest pa]
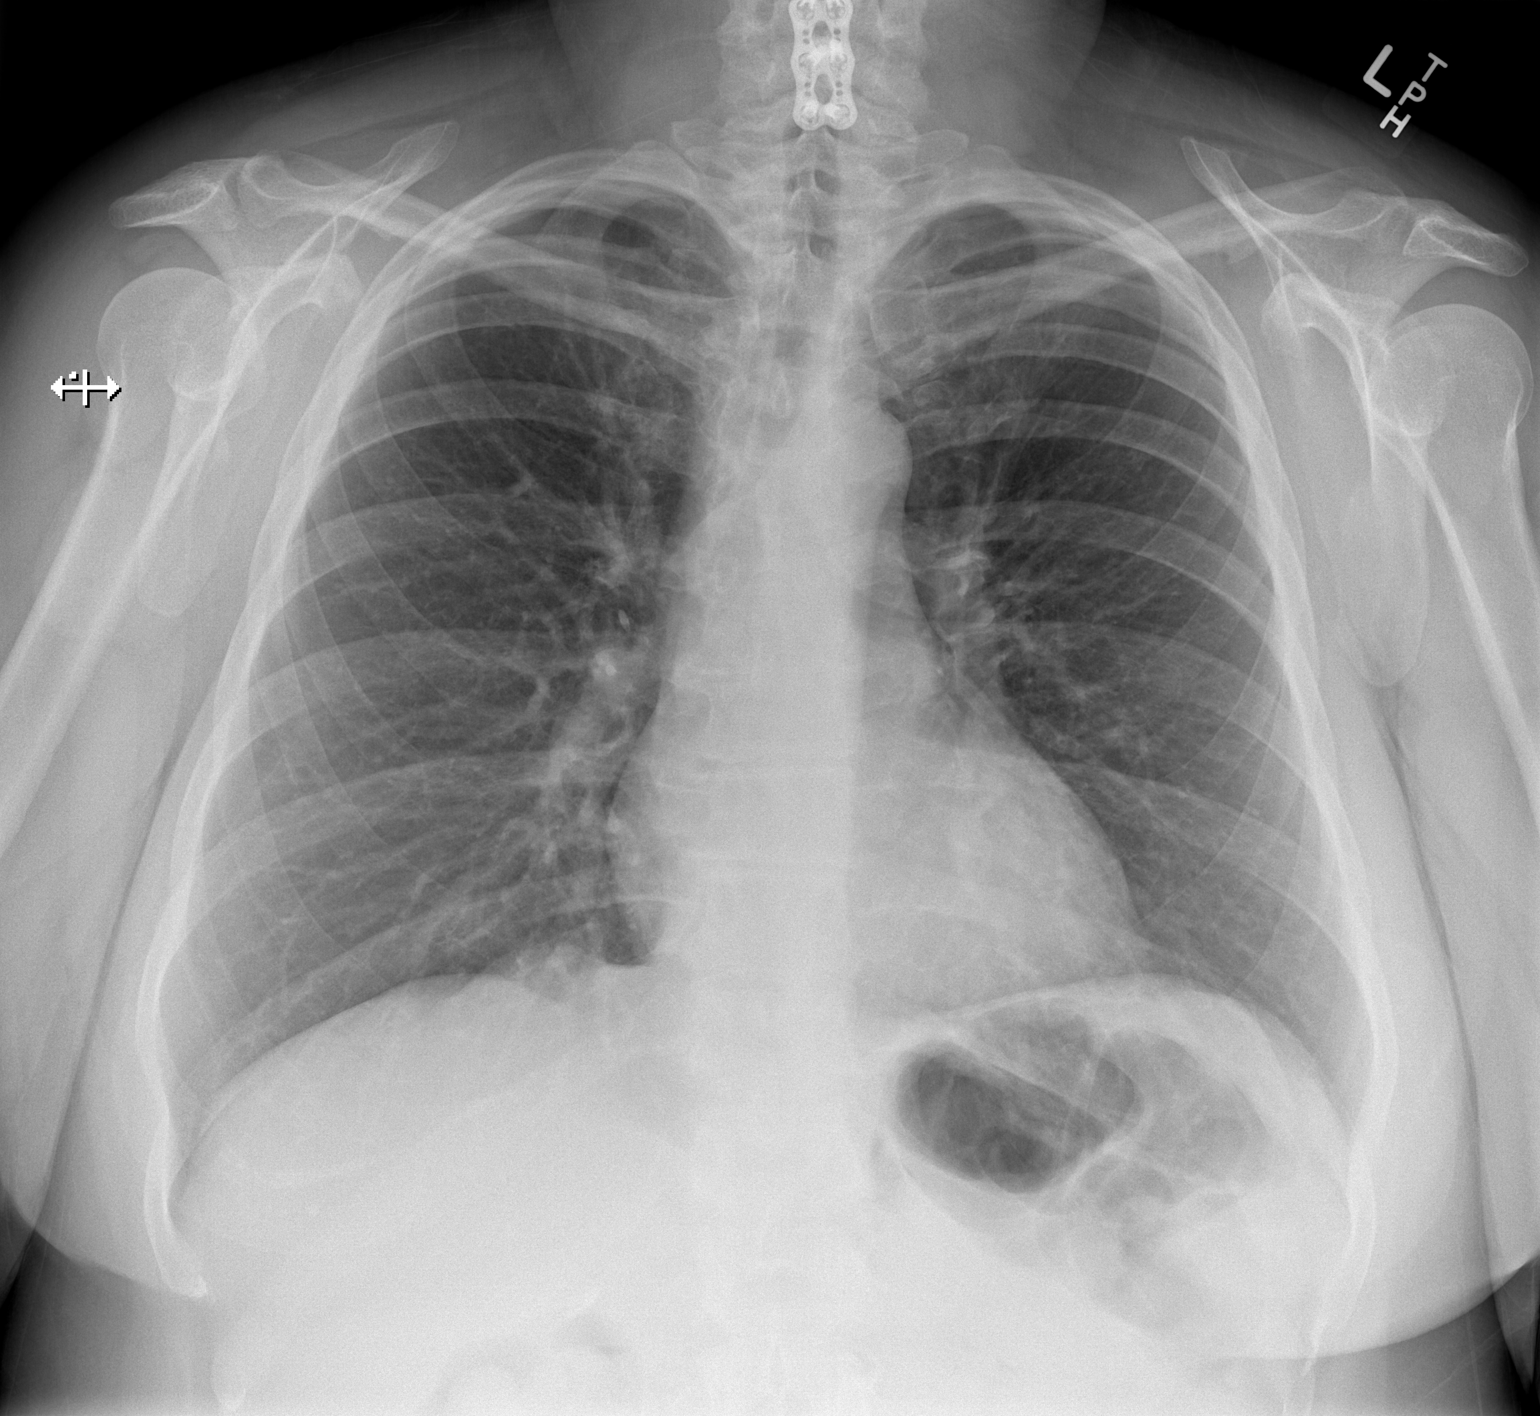

[w chest lat]
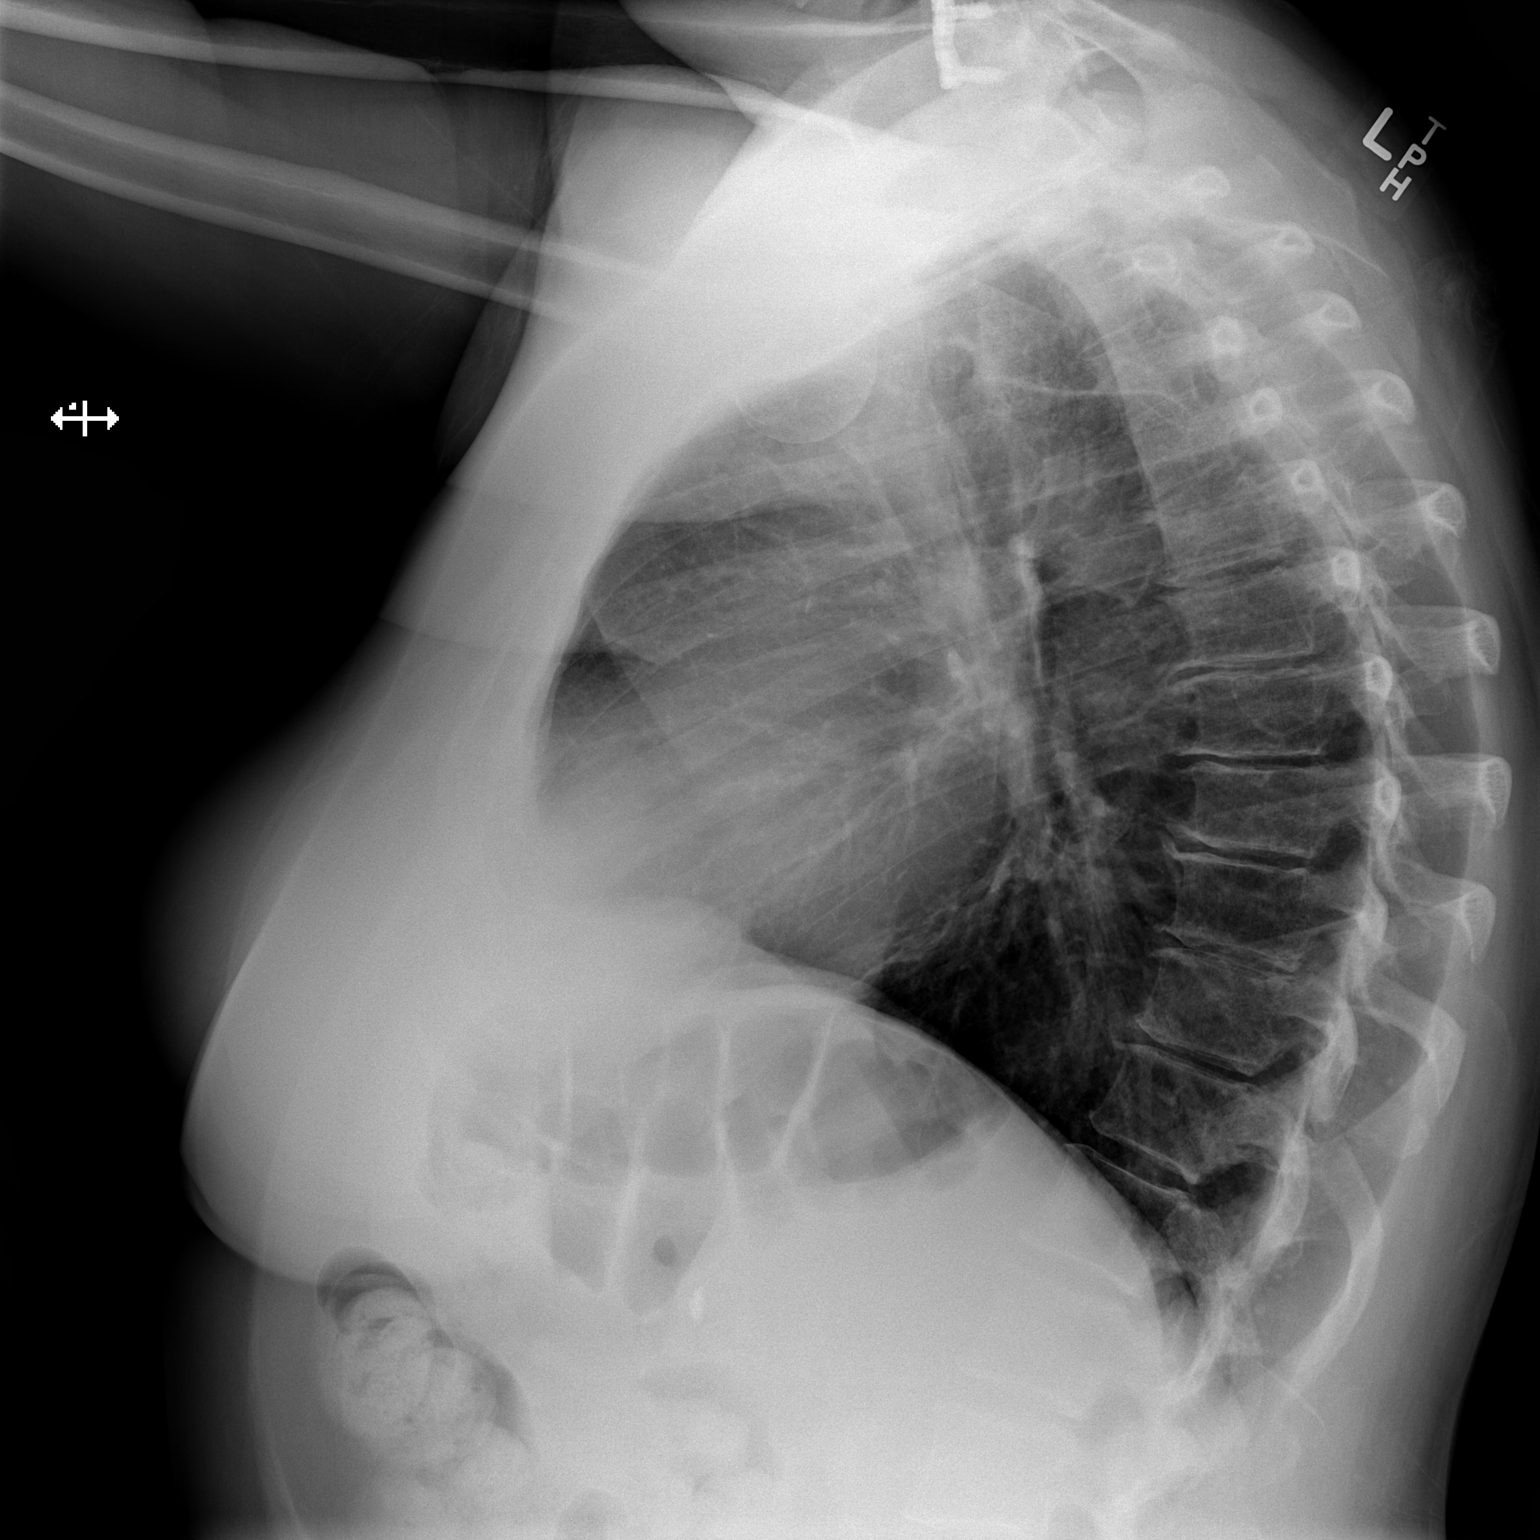

[2 of 2 positions shown; findings below may reference images not displayed]

FINDINGS: Cardiomediastinal silhouette is stable. No acute infiltrate or
pleural effusion. No pulmonary edema. Mild degenerative changes
thoracic spine.
IMPRESSION: No active cardiopulmonary disease.
